# Patient Record
Sex: Male | Born: 1995 | Race: White | Hispanic: No | Marital: Single | Smoking: Never smoker
Health system: Southern US, Community
[De-identification: ages and names within clinical notes are randomized; demographics above are authoritative.]

## PROBLEM LIST (undated history)

## (undated) DIAGNOSIS — C801 Malignant (primary) neoplasm, unspecified: Secondary | ICD-10-CM

---
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
## 2016-01-23 ENCOUNTER — Encounter: Payer: Self-pay | Admitting: Family Medicine

## 2017-02-13 ENCOUNTER — Encounter: Payer: Self-pay | Admitting: Family Medicine

## 2017-02-13 ENCOUNTER — Ambulatory Visit (INDEPENDENT_AMBULATORY_CARE_PROVIDER_SITE_OTHER): Payer: 59 | Admitting: Family Medicine

## 2017-02-13 VITALS — BP 112/78 | Temp 98.1°F | Ht 67.0 in | Wt 132.2 lb

## 2017-02-13 DIAGNOSIS — H9202 Otalgia, left ear: Secondary | ICD-10-CM | POA: Diagnosis not present

## 2017-02-13 DIAGNOSIS — H6123 Impacted cerumen, bilateral: Secondary | ICD-10-CM | POA: Diagnosis not present

## 2017-02-13 NOTE — Progress Notes (Signed)
   Subjective:    Patient ID: [facility], male    DOB: June 08, 1996, 21 y.o.   MRN: [id number]  Otalgia   There is pain in the left ear. This is a new problem. Associated symptoms include hearing loss. Pertinent negatives include no abdominal pain. Associated symptoms comments: Pressure, Dizziness.   States no other concerns this visit.   Upon further questioning he relates that the fullness in the ear makes him feel slightly off balance Review of Systems  Constitutional: Negative for fatigue and fever.  HENT: Positive for ear pain and hearing loss.   Respiratory: Negative for shortness of breath.   Cardiovascular: Negative for chest pain.  Gastrointestinal: Negative for abdominal pain.       Objective:   Physical Exam  Constitutional: He appears well-developed.  HENT:  Head: Normocephalic.  Mouth/Throat: Oropharynx is clear and moist. No oropharyngeal exudate.  Neck: Normal range of motion.  Cardiovascular: Normal rate, regular rhythm and normal heart sounds.   No murmur heard. Pulmonary/Chest: Effort normal and breath sounds normal. He has no wheezes.  Lymphadenopathy:    He has no cervical adenopathy.  Neurological: He exhibits normal muscle tone.  Skin: Skin is warm and dry.  Nursing note and vitals reviewed.  [id number]       Assessment & Plan:  Overall the patient does appear that he is doing much better in regards to his cancer  He follows up with specialist on regular basis  Otalgia with cerumen impaction referral to ENT

## 2017-04-08 IMAGING — CT CT CHEST W/ CM
2 of 3 series · 11 of 36 positions shown, 13 images · IV contrast (Iodine)
Comparison: No priors.

CLINICAL DATA: 19-year-old male with shortness of breath. Possible
collapsed lung.

EXAM:
CT CHEST WITH CONTRAST
TECHNIQUE: Multidetector CT imaging of the chest was performed during
intravenous contrast administration.
CONTRAST:  18 mL OMNIPAQUE IOHEXOL 300 MG/ML  SOLN

[Series 201: chest with, idose (2) · axial · 0.75mm/px · z∈[+123,+413]mm · 8 of 70 slices shown, 10 images]
[im 6/70  mediastinal]
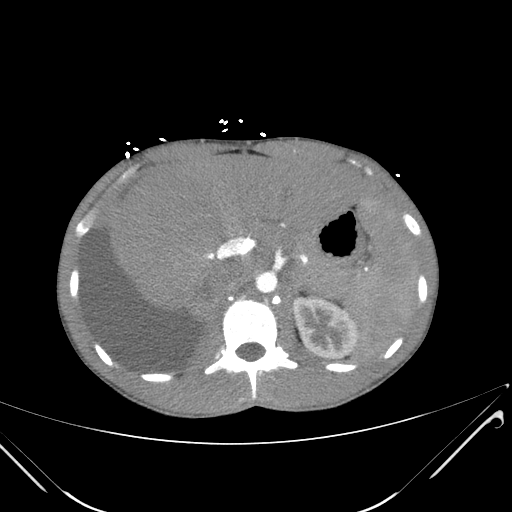
[im 6/70  lung]
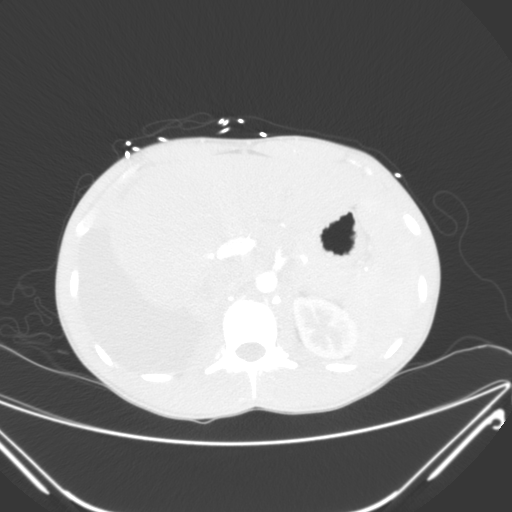
[im 13/70  lung]
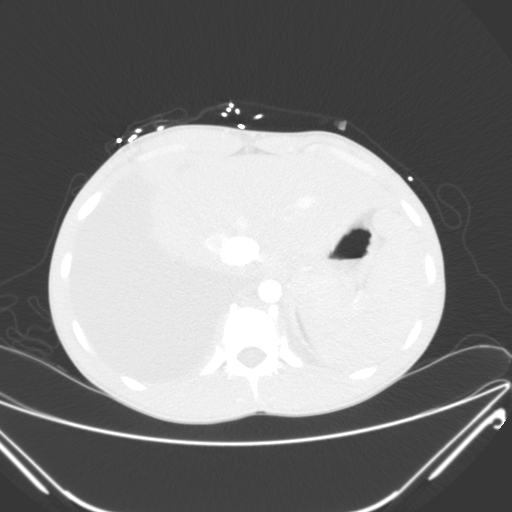
[im 24/70  lung]
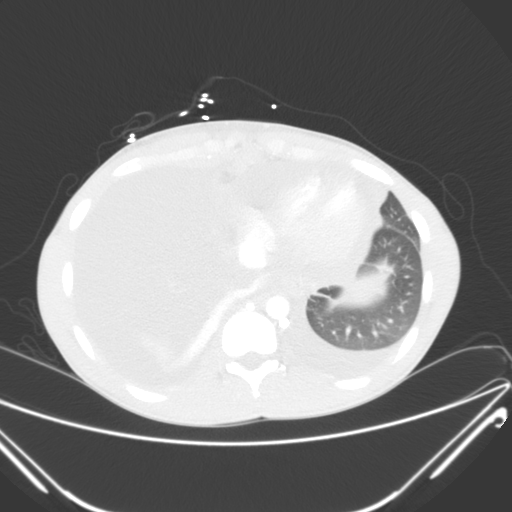
[im 31/70  lung]
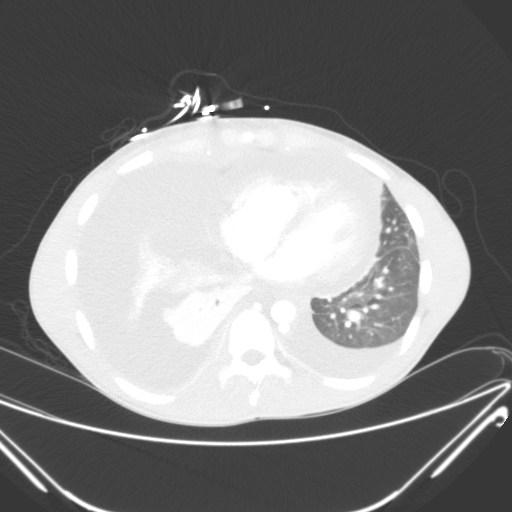
[im 39/70  mediastinal]
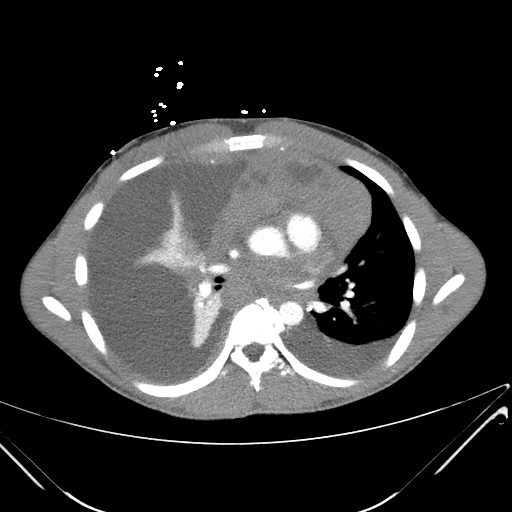
[im 39/70  lung]
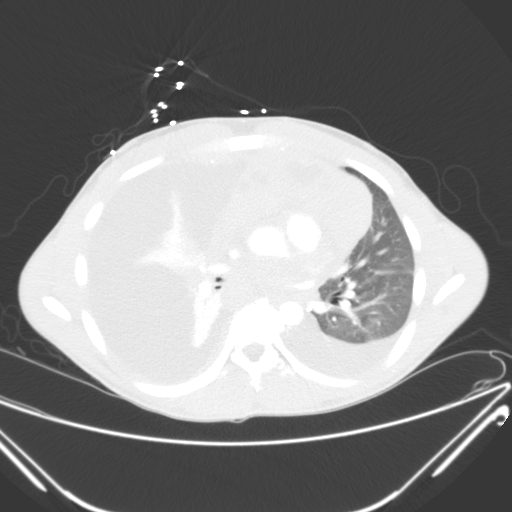
[im 47/70  lung]
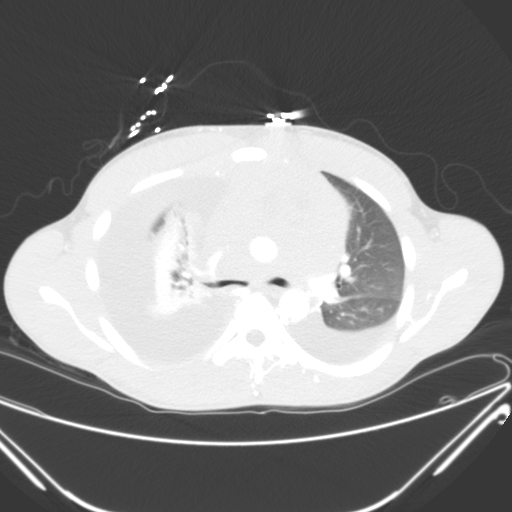
[im 57/70  lung]
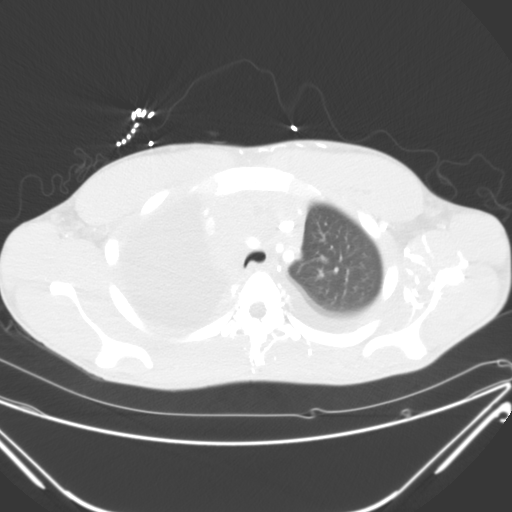
[im 64/70  lung]
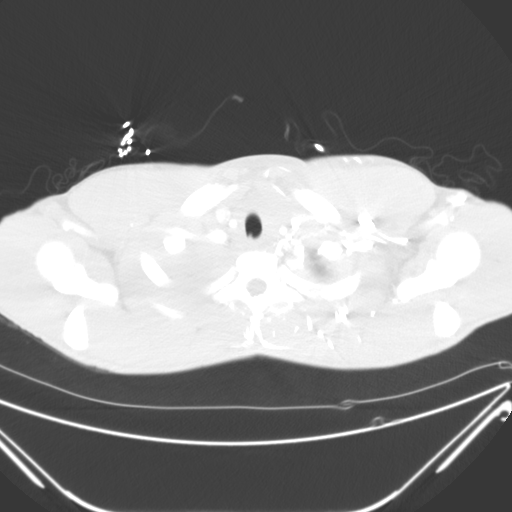

[Series 203: coronal, idose (2) · coronal · 0.45mm/px · 3 of 109 slices shown]
[im 22/109  lung]
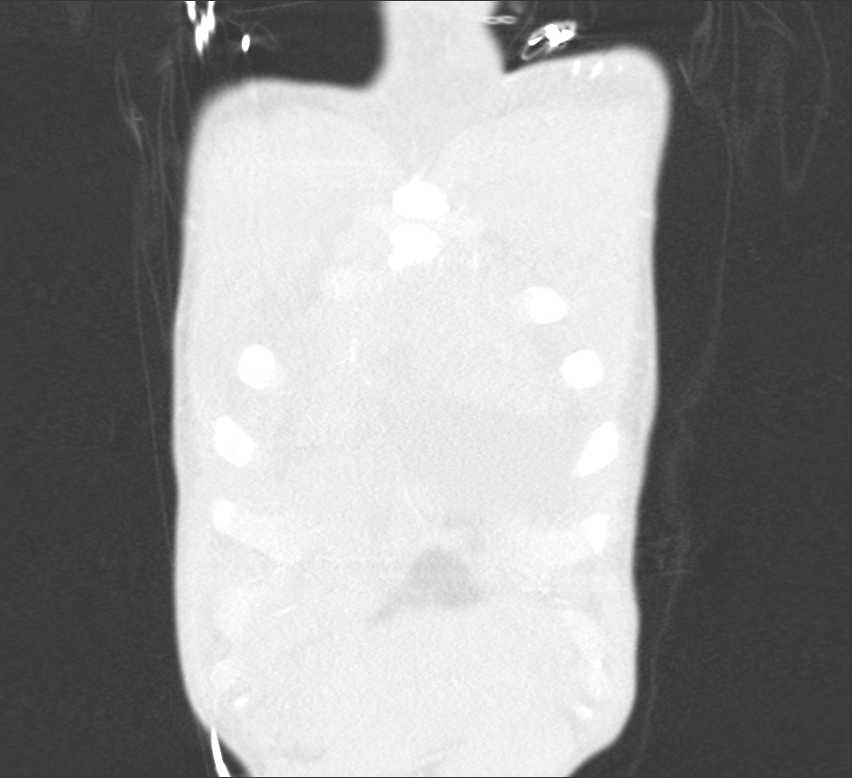
[im 44/109  lung]
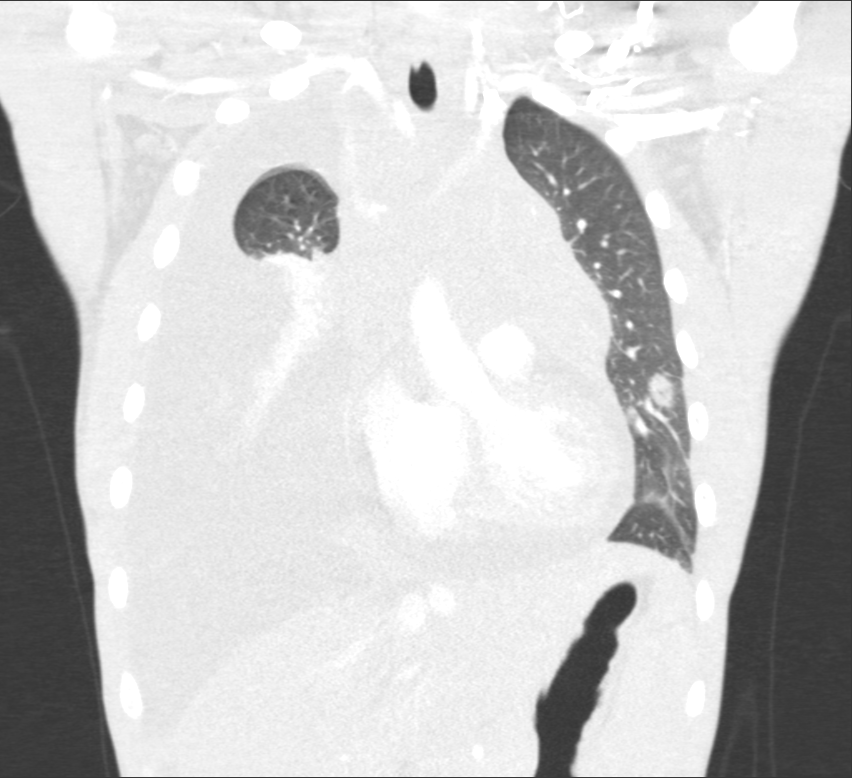
[im 65/109  lung]
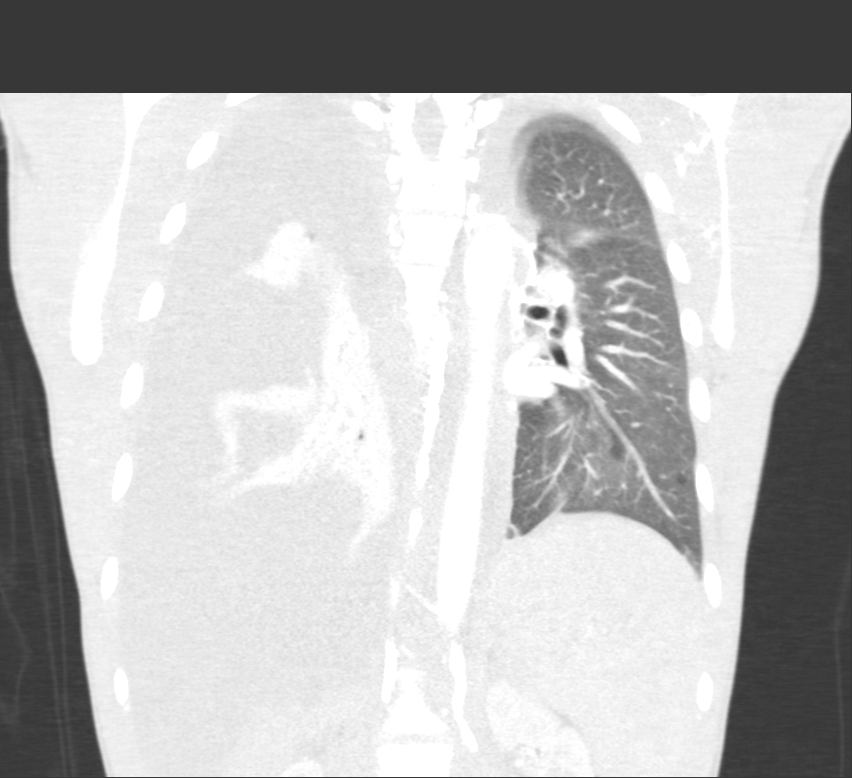

[11 of 36 positions shown; findings below may reference images not displayed]

FINDINGS: Mediastinum/Lymph Nodes: Heart size is normal. Moderate amount of
pericardial fluid and/or thickening. Large amorphous anterior
mediastinal mass estimated to measure at least 5.1 x 5.4 cm,
demonstrating heterogeneous attenuation with internal
low-attenuation areas that may represent cystic regions or areas of
necrosis. There is also abundant abnormal soft tissue in the middle
mediastinum as well, involving subcarinal and paratracheal
(particularly right paratracheal) nodal stations. This mediastinal
lymphadenopathy exerts significant mass effect upon all surrounding
structures, particularly venous structures which are markedly
narrowed. In particular, there is near complete occlusion of the
innominate vein, and severe narrowing of the superior vena cava.
Abnormal soft tissue is also noted in the internal mammary
distributions bilaterally.

Lungs/Pleura: Large right pleural effusion with near complete
atelectasis of the right lung (right middle and lower lobes are
completely atelectatic, while there is still some preserved aeration
in portions of the right upper lobe). Extensive right-sided pleural
thickening and nodularity. Moderate left pleural effusion layering
dependently. This is associated with minimal passive atelectasis in
the left lung. Several large pleural based nodules are noted in the
base of the left hemithorax, particularly anteriorly where there are
2 adjacent nodules measuring 19 x 15 mm and 16 x 21 mm on image 52
of series 201. Several ill-defined pulmonary nodules are also noted
within the left lung, the largest of which measures approximately
1.4 cm in the inferior segment of the lingula (image 36 of series
205).

Upper Abdomen: Extensive retroperitoneal and upper abdominal
lymphadenopathy is incompletely visualized, however, on image 70 of
series 201 mucousy a large conglomerate nodal mass in the right
retroperitoneum encasing the proximal right renal artery.

Musculoskeletal/Soft Tissues: There are no aggressive appearing
lytic or blastic lesions noted in the visualized portions of the
skeleton.
IMPRESSION: 1. Clear evidence of malignancy in the thorax and upper abdomen, as
detailed above. Given the widespread soft tissue masses involving
multiple mediastinal nodal stations, and visualized portions of the
upper abdomen, this is strongly favored to represent a malignant
lymphoma. Correlation with tissue biopsy is recommended.
2. The apparent pleural nodularity likely represents involvement
with pleural and extrapleural lymphatics, but correlation with
tissue biopsy is recommended.
3. Near complete obstruction of the superior vena cava and
innominate vein related to extrinsic mass effect from mediastinal
masses, which may result in superior vena cava syndrome.
4. Large right and small left pleural effusions with extensive
passive atelectasis, particularly in the right lung.
5. Multiple small pulmonary nodules in the left lung. These could be
infectious or inflammatory, or could represent areas of lymphomatous
involvement.
These results were called by telephone at the time of interpretation
on 05/08/2015 at [DATE] to Dr. ELENITA LANDRETH, who verbally
acknowledged these results.

## 2017-11-05 MED ORDER — GENERIC EXTERNAL MEDICATION
50.00 | Status: DC
Start: ? — End: 2017-11-05

## 2017-11-06 MED ORDER — GENERIC EXTERNAL MEDICATION
50.00 | Status: DC
Start: ? — End: 2017-11-06

## 2017-11-06 MED ORDER — SODIUM CHLORIDE 0.9 % IV SOLN
INTRAVENOUS | Status: DC
Start: ? — End: 2017-11-06

## 2017-11-12 MED ORDER — SODIUM CHLORIDE 0.9 % IV SOLN
INTRAVENOUS | Status: DC
Start: ? — End: 2017-11-12

## 2017-11-12 MED ORDER — GENERIC EXTERNAL MEDICATION
50.00 | Status: DC
Start: ? — End: 2017-11-12

## 2017-11-22 MED ORDER — SULFAMETHOXAZOLE-TRIMETHOPRIM 800-160 MG PO TABS
1.00 | ORAL_TABLET | ORAL | Status: DC
Start: 2017-11-22 — End: 2017-11-22

## 2017-11-22 MED ORDER — GENERIC EXTERNAL MEDICATION
500.00 | Status: DC
Start: ? — End: 2017-11-22

## 2017-11-22 MED ORDER — ACETAMINOPHEN 325 MG PO TABS
650.00 | ORAL_TABLET | ORAL | Status: DC
Start: ? — End: 2017-11-22

## 2017-11-22 MED ORDER — ONDANSETRON 4 MG PO TBDP
4.00 | ORAL_TABLET | ORAL | Status: DC
Start: ? — End: 2017-11-22

## 2017-11-22 MED ORDER — RANITIDINE HCL 75 MG PO TABS
75.00 | ORAL_TABLET | ORAL | Status: DC
Start: ? — End: 2017-11-22

## 2017-11-22 MED ORDER — POLYETHYLENE GLYCOL 3350 17 G PO PACK
17.00 g | PACK | ORAL | Status: DC
Start: ? — End: 2017-11-22

## 2017-11-22 MED ORDER — GENERIC EXTERNAL MEDICATION
1.00 g | Status: DC
Start: 2017-11-22 — End: 2017-11-22

## 2017-11-22 MED ORDER — DRONABINOL 2.5 MG PO CAPS
5.00 | ORAL_CAPSULE | ORAL | Status: DC
Start: ? — End: 2017-11-22

## 2017-11-22 MED ORDER — AZITHROMYCIN 250 MG PO TABS
500.00 | ORAL_TABLET | ORAL | Status: DC
Start: 2017-11-22 — End: 2017-11-22

## 2017-11-22 MED ORDER — LIDOCAINE-PRILOCAINE 2.5-2.5 % EX CREA
TOPICAL_CREAM | CUTANEOUS | Status: DC
Start: ? — End: 2017-11-22

## 2017-11-22 MED ORDER — DIPHENHYDRAMINE HCL 25 MG PO CAPS
25.00 | ORAL_CAPSULE | ORAL | Status: DC
Start: ? — End: 2017-11-22

## 2017-11-22 MED ORDER — LIDOCAINE-TRANSPARENT DRESSING 4 % EX KIT
PACK | CUTANEOUS | Status: DC
Start: ? — End: 2017-11-22

## 2018-10-06 HISTORY — PX: PORT-A-CATH REMOVAL: SHX5289

## 2019-03-01 ENCOUNTER — Other Ambulatory Visit: Payer: Self-pay

## 2019-03-01 ENCOUNTER — Ambulatory Visit
Admission: EM | Admit: 2019-03-01 | Discharge: 2019-03-01 | Disposition: A | Discharge status: Home / Self Care | Payer: BLUE CROSS/BLUE SHIELD

## 2019-03-01 DIAGNOSIS — H6123 Impacted cerumen, bilateral: Secondary | ICD-10-CM

## 2019-03-01 HISTORY — DX: Malignant (primary) neoplasm, unspecified: C80.1

## 2019-03-01 NOTE — ED Provider Notes (Signed)
[place]   [id number] 03/01/19 Arrival Time: [place]: History from: patient.  Spencer [facility]clean is a 23 y.o. male who presents with complaint of bilateral ear fullness for the past 4 days.  Denies a precipitating event.  Denies sick exposure to COVID, flu or strep.  Denies recent travel.  Patient has tried OTC ear drops without relief.  Reports similar symptoms in the past that improved with ear lavage.    Denies fever, chills, fatigue, sinus pain, rhinorrhea, ear discharge, sore throat, SOB, wheezing, cough, chest pain, nausea, changes in bowel or bladder habits.    ROS: As per HPI.  Past Medical History:  Diagnosis Date  . Cancer [facility])    Past Surgical History:  Procedure Laterality Date  . PORT-A-CATH REMOVAL  10/06/2018   Allergies  Allergen Reactions  . Neomycin    No current facility-administered medications on file prior to encounter.    No current outpatient medications on file prior to encounter.   Social History   Socioeconomic History  . Marital status: Single    Spouse name: Not on file  . Number of children: Not on file  . Years of education: Not on file  . Highest education level: Not on file  Occupational History  . Not on file  Social Needs  . Financial resource strain: Not on file  . Food insecurity:    Worry: Not on file    Inability: Not on file  . Transportation needs:    Medical: Not on file    Non-medical: Not on file  Tobacco Use  . Smoking status: Never Smoker  . Smokeless tobacco: Never Used  Substance and Sexual Activity  . Alcohol use: Not Currently    Alcohol/week: 0.0 standard drinks  . Drug use: Not Currently  . Sexual activity: Not Currently  Lifestyle  . Physical activity:    Days per week: Not on file    Minutes per session: Not on file  . Stress: Not on file  Relationships  . Social connections:    Talks on phone: Not on file    Gets together: Not on file    Attends religious service: Not  on file    Active member of club or organization: Not on file    Attends meetings of clubs or organizations: Not on file    Relationship status: Not on file  . Intimate partner violence:    Fear of current or ex partner: Not on file    Emotionally abused: Not on file    Physically abused: Not on file    Forced sexual activity: Not on file  Other Topics Concern  . Not on file  Social History Narrative  . Not on file   Family History  Problem Relation Age of Onset  . Hyperlipidemia Father     OBJECTIVE:  Vitals:   03/01/19 1100  BP: 127/70  Pulse: 96  Resp: 18  Temp: 98.3 F (36.8 C)  SpO2: 96%     General appearance: alert; well-appearing HEENT: NCAT; Ears: EACs obstructed by wax; Eyes: PERRL, EOMI grossly; Nose: patent without rhinorrhea; Throat: oropharynx clear, tonsils not enlarged or erythematous, uvula midline Neck: supple without LAD Lungs: normal respiratory effort Skin: warm and dry Psychological: alert and cooperative; normal mood and affect   PROCEDURE:  RT ear cleared with curette.  LT ear partially cleared with minimal bleeding to EAC with curette.  Consent granted.  Left ear lavage performed by RN Amy Stone.  TM visualized by RN.  PT tolerated procedure well.     ASSESSMENT & PLAN:  1. Bilateral impacted cerumen     Wax removal performed with curette and lavage Follow up here or with PCP as needed Return or go to the ED if you have any new or worsening symptoms such as fever, chills, nausea, vomiting, decreased hearing, ear discharge, ear pain, chest pain, coughing, etc...   Reviewed expectations re: course of current medical issues. Questions answered. Outlined signs and symptoms indicating need for more acute intervention. Patient verbalized understanding. After Visit Summary given.         Lestine Box, PA-C 03/01/19 1215

## 2019-03-01 NOTE — ED Triage Notes (Signed)
Pt has experienced ear fullness for past 4 days, left worse than right

## 2019-03-01 NOTE — Discharge Instructions (Signed)
Wax removal performed with curette and lavage Follow up here or with PCP as needed Return or go to the ED if you have any new or worsening symptoms such as fever, chills, nausea, vomiting, decreased hearing, ear discharge, ear pain, chest pain, coughing, etc..Marland Kitchen

## 2019-03-28 DIAGNOSIS — C91Z0 Other lymphoid leukemia not having achieved remission: Secondary | ICD-10-CM | POA: Diagnosis not present

## 2019-04-25 DIAGNOSIS — Z9221 Personal history of antineoplastic chemotherapy: Secondary | ICD-10-CM | POA: Diagnosis not present

## 2019-04-25 DIAGNOSIS — C91Z0 Other lymphoid leukemia not having achieved remission: Secondary | ICD-10-CM | POA: Diagnosis not present

## 2019-04-25 DIAGNOSIS — C91Z1 Other lymphoid leukemia, in remission: Secondary | ICD-10-CM | POA: Diagnosis not present

## 2019-05-02 DIAGNOSIS — Z1283 Encounter for screening for malignant neoplasm of skin: Secondary | ICD-10-CM | POA: Diagnosis not present

## 2019-05-02 DIAGNOSIS — D225 Melanocytic nevi of trunk: Secondary | ICD-10-CM | POA: Diagnosis not present

## 2019-05-02 DIAGNOSIS — D485 Neoplasm of uncertain behavior of skin: Secondary | ICD-10-CM | POA: Diagnosis not present

## 2019-05-02 DIAGNOSIS — D2271 Melanocytic nevi of right lower limb, including hip: Secondary | ICD-10-CM | POA: Diagnosis not present

## 2019-05-23 DIAGNOSIS — C9101 Acute lymphoblastic leukemia, in remission: Secondary | ICD-10-CM | POA: Diagnosis not present

## 2019-06-27 DIAGNOSIS — Z856 Personal history of leukemia: Secondary | ICD-10-CM | POA: Diagnosis not present

## 2019-06-27 DIAGNOSIS — Z79899 Other long term (current) drug therapy: Secondary | ICD-10-CM | POA: Diagnosis not present

## 2019-06-27 DIAGNOSIS — R030 Elevated blood-pressure reading, without diagnosis of hypertension: Secondary | ICD-10-CM | POA: Diagnosis not present

## 2019-06-27 DIAGNOSIS — Z9221 Personal history of antineoplastic chemotherapy: Secondary | ICD-10-CM | POA: Diagnosis not present

## 2019-06-27 DIAGNOSIS — C9101 Acute lymphoblastic leukemia, in remission: Secondary | ICD-10-CM | POA: Diagnosis not present

## 2019-07-26 DIAGNOSIS — C9100 Acute lymphoblastic leukemia not having achieved remission: Secondary | ICD-10-CM | POA: Diagnosis not present

## 2019-08-22 DIAGNOSIS — Z856 Personal history of leukemia: Secondary | ICD-10-CM | POA: Diagnosis not present

## 2019-08-22 DIAGNOSIS — Z888 Allergy status to other drugs, medicaments and biological substances status: Secondary | ICD-10-CM | POA: Diagnosis not present

## 2019-08-22 DIAGNOSIS — Z881 Allergy status to other antibiotic agents status: Secondary | ICD-10-CM | POA: Diagnosis not present

## 2019-08-22 DIAGNOSIS — Z9221 Personal history of antineoplastic chemotherapy: Secondary | ICD-10-CM | POA: Diagnosis not present

## 2019-08-22 DIAGNOSIS — R030 Elevated blood-pressure reading, without diagnosis of hypertension: Secondary | ICD-10-CM | POA: Diagnosis not present

## 2019-08-22 DIAGNOSIS — C91Z1 Other lymphoid leukemia, in remission: Secondary | ICD-10-CM | POA: Diagnosis not present

## 2019-10-31 DIAGNOSIS — C9101 Acute lymphoblastic leukemia, in remission: Secondary | ICD-10-CM | POA: Diagnosis not present

## 2019-10-31 DIAGNOSIS — Z79899 Other long term (current) drug therapy: Secondary | ICD-10-CM | POA: Diagnosis not present

## 2019-10-31 DIAGNOSIS — D696 Thrombocytopenia, unspecified: Secondary | ICD-10-CM | POA: Diagnosis not present

## 2019-10-31 DIAGNOSIS — Z856 Personal history of leukemia: Secondary | ICD-10-CM | POA: Diagnosis not present

## 2019-10-31 DIAGNOSIS — F329 Major depressive disorder, single episode, unspecified: Secondary | ICD-10-CM | POA: Diagnosis not present

## 2019-10-31 DIAGNOSIS — Z9221 Personal history of antineoplastic chemotherapy: Secondary | ICD-10-CM | POA: Diagnosis not present

## 2019-11-02 ENCOUNTER — Encounter: Payer: Self-pay | Admitting: Family Medicine

## 2019-11-03 ENCOUNTER — Encounter: Payer: Self-pay | Admitting: Family Medicine

## 2019-12-26 DIAGNOSIS — Z856 Personal history of leukemia: Secondary | ICD-10-CM | POA: Diagnosis not present

## 2019-12-26 DIAGNOSIS — Z9221 Personal history of antineoplastic chemotherapy: Secondary | ICD-10-CM | POA: Diagnosis not present

## 2019-12-26 DIAGNOSIS — D696 Thrombocytopenia, unspecified: Secondary | ICD-10-CM | POA: Diagnosis not present

## 2019-12-26 DIAGNOSIS — C9101 Acute lymphoblastic leukemia, in remission: Secondary | ICD-10-CM | POA: Diagnosis not present

## 2019-12-26 DIAGNOSIS — F320 Major depressive disorder, single episode, mild: Secondary | ICD-10-CM | POA: Diagnosis not present

## 2020-01-11 DIAGNOSIS — Z03818 Encounter for observation for suspected exposure to other biological agents ruled out: Secondary | ICD-10-CM | POA: Diagnosis not present

## 2020-01-11 DIAGNOSIS — Z20828 Contact with and (suspected) exposure to other viral communicable diseases: Secondary | ICD-10-CM | POA: Diagnosis not present

## 2020-01-25 DIAGNOSIS — Z20822 Contact with and (suspected) exposure to covid-19: Secondary | ICD-10-CM | POA: Diagnosis not present

## 2020-01-25 DIAGNOSIS — Z20828 Contact with and (suspected) exposure to other viral communicable diseases: Secondary | ICD-10-CM | POA: Diagnosis not present

## 2020-01-26 DIAGNOSIS — C9100 Acute lymphoblastic leukemia not having achieved remission: Secondary | ICD-10-CM | POA: Diagnosis not present

## 2020-01-26 DIAGNOSIS — Z113 Encounter for screening for infections with a predominantly sexual mode of transmission: Secondary | ICD-10-CM | POA: Diagnosis not present

## 2020-02-02 DIAGNOSIS — B2790 Infectious mononucleosis, unspecified without complication: Secondary | ICD-10-CM | POA: Diagnosis not present

## 2020-02-02 DIAGNOSIS — J988 Other specified respiratory disorders: Secondary | ICD-10-CM | POA: Diagnosis not present

## 2020-02-27 DIAGNOSIS — C9101 Acute lymphoblastic leukemia, in remission: Secondary | ICD-10-CM | POA: Diagnosis not present

## 2020-02-27 DIAGNOSIS — Z856 Personal history of leukemia: Secondary | ICD-10-CM | POA: Diagnosis not present

## 2020-02-27 DIAGNOSIS — Z9221 Personal history of antineoplastic chemotherapy: Secondary | ICD-10-CM | POA: Diagnosis not present

## 2020-02-27 DIAGNOSIS — D696 Thrombocytopenia, unspecified: Secondary | ICD-10-CM | POA: Diagnosis not present

## 2020-04-30 DIAGNOSIS — C9101 Acute lymphoblastic leukemia, in remission: Secondary | ICD-10-CM | POA: Diagnosis not present

## 2020-07-02 DIAGNOSIS — R110 Nausea: Secondary | ICD-10-CM | POA: Diagnosis not present

## 2020-07-02 DIAGNOSIS — Z9221 Personal history of antineoplastic chemotherapy: Secondary | ICD-10-CM | POA: Diagnosis not present

## 2020-07-02 DIAGNOSIS — C9101 Acute lymphoblastic leukemia, in remission: Secondary | ICD-10-CM | POA: Diagnosis not present

## 2020-07-16 DIAGNOSIS — F411 Generalized anxiety disorder: Secondary | ICD-10-CM | POA: Diagnosis not present

## 2020-08-07 ENCOUNTER — Encounter: Payer: Self-pay | Admitting: Family Medicine

## 2020-08-07 ENCOUNTER — Ambulatory Visit (INDEPENDENT_AMBULATORY_CARE_PROVIDER_SITE_OTHER): Payer: BC Managed Care – PPO | Admitting: Family Medicine

## 2020-08-07 VITALS — BP 124/86 | HR 88 | Temp 98.1°F | Wt 130.2 lb

## 2020-08-07 DIAGNOSIS — F419 Anxiety disorder, unspecified: Secondary | ICD-10-CM | POA: Diagnosis not present

## 2020-08-07 DIAGNOSIS — F32A Depression, unspecified: Secondary | ICD-10-CM | POA: Diagnosis not present

## 2020-08-07 DIAGNOSIS — C9101 Acute lymphoblastic leukemia, in remission: Secondary | ICD-10-CM

## 2020-08-07 MED ORDER — ESCITALOPRAM OXALATE 10 MG PO TABS: 10.0000 mg | ORAL_TABLET | Freq: Every day | ORAL | 2 refills | Status: DC

## 2020-08-07 NOTE — Progress Notes (Signed)
Patient ID: Spencer [facility], male    DOB: 04/22/1996, 24 y.o.   MRN: [id number]   Chief Complaint  Patient presents with  . Depression    Patient has been seeing psychiatry at [facility] and prescribed 10mg  Lexapro. Patient has recently graduated and needs this taken over by pcp.   . Anxiety   Subjective:    HPI F/u depression.  Pt is recent graduate from [facility].    Seen by psychiatry from [facility] and put on lexapro from 6/20,  After graduation from [facility] needing to see pcp for meds. Treating depression. Saw therapist in 2020. Pt is out of college now, working as an account.  Has been off the medication for a couple weeks.  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 Was seen [facility].  q61mo there with onologist. Did chemo and in remission.  HR usually around 70-80s.  99% and 88bpm.- on recheck.  Elevated HR at 114, initially, pt stating has anxiety in dr offices.  Has work and relationship stressors at this time.  Seeing counselor again, restarted last week. Seeing bi-weekly.    Medical History [facility] has a past medical history of Cancer ([facility]).   Outpatient Encounter Medications as of 08/07/2020  Medication Sig  . [DISCONTINUED] escitalopram (LEXAPRO) 10 MG tablet   . escitalopram (LEXAPRO) 10 MG tablet Take 1 tablet (10 mg total) by mouth daily.   No facility-administered encounter medications on file as of 08/07/2020.     Review of Systems  Constitutional: Negative for chills and fever.  HENT: Negative for congestion, rhinorrhea and sore throat.   Respiratory: Negative for cough, shortness of breath and wheezing.   Cardiovascular: Negative for chest pain and leg swelling.  Gastrointestinal: Negative for abdominal pain, diarrhea, nausea and vomiting.  Genitourinary: Negative for dysuria and frequency.  Skin: Negative for rash.  Neurological: Negative for dizziness, weakness and headaches.  Psychiatric/Behavioral: Positive for dysphoric mood.  Negative for sleep disturbance and suicidal ideas. The patient is nervous/anxious.      Vitals BP 124/86   Pulse 88   Temp 98.1 F (36.7 C)   Wt 130 lb 3.2 oz (59.1 kg)   SpO2 100%   BMI 20.39 kg/m   Objective:   Physical Exam Vitals and nursing note reviewed.  Constitutional:      General: He is not in acute distress.    Appearance: Normal appearance. He is not ill-appearing.  HENT:     Head: Normocephalic.     Nose: Nose normal. No congestion.     Mouth/Throat:     Mouth: Mucous membranes are moist.     Pharynx: No oropharyngeal exudate.  Eyes:     Extraocular Movements: Extraocular movements intact.     Conjunctiva/sclera: Conjunctivae normal.     Pupils: Pupils are equal, round, and reactive to light.  Cardiovascular:     Rate and Rhythm: Normal rate and regular rhythm.     Pulses: Normal pulses.     Heart sounds: Normal heart sounds. No murmur heard.   Pulmonary:     Effort: Pulmonary effort is normal.     Breath sounds: Normal breath sounds. No wheezing, rhonchi or rales.  Musculoskeletal:        General: Normal range of motion.     Right lower leg: No edema.     Left lower leg: No edema.  Skin:    General: Skin is warm and dry.     Findings: No rash.  Neurological:     General: No focal deficit present.     Mental Status: He is alert and oriented to person, place, and time.     Cranial Nerves: No cranial nerve deficit.  Psychiatric:        Behavior: Behavior normal.        Thought Content: Thought content normal.        Judgment: Judgment normal.     Comments: +anxious mood      Assessment and Plan   1. Depression, unspecified depression type  2. Anxiety  3. ALL (acute lymphoid leukemia) in remission [facility])   Depresion/anxiety- Not controlled, however, pt ran out of meds 2 wks ago.   Didn't know who to get another refill from since was not able to see his psychiatrist any longer from [facility], since graduating.  Has been off his medication for 2  wks, was on lexapro. -pt cont with counseling.  -call or rto if feeling like anxiety not controlled on the 10mg  lexapro, may need to increase to 20mg  or add buspar for anxiety.  Pt wanting to just try to go back on lexapro at this time at 10mg  dose.  F/u 3 months or sooner if not improving.

## 2020-08-08 DIAGNOSIS — Z23 Encounter for immunization: Secondary | ICD-10-CM | POA: Diagnosis not present

## 2020-08-15 DIAGNOSIS — F411 Generalized anxiety disorder: Secondary | ICD-10-CM | POA: Diagnosis not present

## 2020-08-23 ENCOUNTER — Other Ambulatory Visit: Payer: Self-pay | Admitting: *Deleted

## 2020-08-23 MED ORDER — BUSPIRONE HCL 10 MG PO TABS: 10.0000 mg | ORAL_TABLET | Freq: Three times a day (TID) | ORAL | 0 refills | Status: DC

## 2020-08-27 DIAGNOSIS — F411 Generalized anxiety disorder: Secondary | ICD-10-CM | POA: Diagnosis not present

## 2020-09-03 DIAGNOSIS — F419 Anxiety disorder, unspecified: Secondary | ICD-10-CM | POA: Diagnosis not present

## 2020-09-03 DIAGNOSIS — R110 Nausea: Secondary | ICD-10-CM | POA: Diagnosis not present

## 2020-09-03 DIAGNOSIS — F32A Depression, unspecified: Secondary | ICD-10-CM | POA: Diagnosis not present

## 2020-09-03 DIAGNOSIS — C9101 Acute lymphoblastic leukemia, in remission: Secondary | ICD-10-CM | POA: Diagnosis not present

## 2020-09-03 DIAGNOSIS — F411 Generalized anxiety disorder: Secondary | ICD-10-CM | POA: Diagnosis not present

## 2020-09-08 DIAGNOSIS — H6123 Impacted cerumen, bilateral: Secondary | ICD-10-CM | POA: Diagnosis not present

## 2020-09-10 DIAGNOSIS — F411 Generalized anxiety disorder: Secondary | ICD-10-CM | POA: Diagnosis not present

## 2020-09-19 DIAGNOSIS — D225 Melanocytic nevi of trunk: Secondary | ICD-10-CM | POA: Diagnosis not present

## 2020-09-19 DIAGNOSIS — Z1283 Encounter for screening for malignant neoplasm of skin: Secondary | ICD-10-CM | POA: Diagnosis not present

## 2020-09-19 DIAGNOSIS — L720 Epidermal cyst: Secondary | ICD-10-CM | POA: Diagnosis not present

## 2020-09-24 DIAGNOSIS — F411 Generalized anxiety disorder: Secondary | ICD-10-CM | POA: Diagnosis not present

## 2020-09-25 ENCOUNTER — Other Ambulatory Visit: Payer: Self-pay | Admitting: Family Medicine

## 2020-10-08 DIAGNOSIS — F411 Generalized anxiety disorder: Secondary | ICD-10-CM | POA: Diagnosis not present

## 2020-10-24 DIAGNOSIS — F411 Generalized anxiety disorder: Secondary | ICD-10-CM | POA: Diagnosis not present

## 2020-11-07 ENCOUNTER — Encounter: Payer: Self-pay | Admitting: Family Medicine

## 2020-11-07 ENCOUNTER — Ambulatory Visit: Payer: BC Managed Care – PPO | Admitting: Family Medicine

## 2020-11-07 ENCOUNTER — Other Ambulatory Visit: Payer: Self-pay

## 2020-11-07 VITALS — BP 118/76 | HR 84 | Temp 98.2°F | Ht 67.0 in | Wt 134.4 lb

## 2020-11-07 DIAGNOSIS — F32A Depression, unspecified: Secondary | ICD-10-CM

## 2020-11-07 DIAGNOSIS — F419 Anxiety disorder, unspecified: Secondary | ICD-10-CM

## 2020-11-07 MED ORDER — ESCITALOPRAM OXALATE 10 MG PO TABS: 10.0000 mg | ORAL_TABLET | Freq: Every day | ORAL | 2 refills | Status: DC

## 2020-11-07 NOTE — Progress Notes (Signed)
Patient ID: Spencer [facility], male    DOB: 01-10-1996, 25 y.o.   MRN: [id number]   Chief Complaint  Patient presents with  . Anxiety    Follow up- patient states he is doing good on current meds   Subjective:    HPI F/u anxiety- Pt stating working well with lexapro.  Taking buspar prn, not needing it recently. Doing well with work, and able to handle it better.  Was in a stressful relationship.  Felt that he wasn't in a good relationship.  Since break-up in January, things are much better, and less stressful.   No sleep issues.    Medical History Fisher has a past medical history of Cancer ([place]).   Outpatient Encounter Medications as of 11/07/2020  Medication Sig  . busPIRone (BUSPAR) 10 MG tablet TAKE 1 TABLET(10 MG) BY MOUTH THREE TIMES DAILY  . escitalopram (LEXAPRO) 10 MG tablet Take 1 tablet (10 mg total) by mouth daily.  . [DISCONTINUED] escitalopram (LEXAPRO) 10 MG tablet Take 1 tablet (10 mg total) by mouth daily.   No facility-administered encounter medications on file as of 11/07/2020.     Review of Systems  Constitutional: Negative for chills and fever.  HENT: Negative for congestion, rhinorrhea and sore throat.   Respiratory: Negative for cough, shortness of breath and wheezing.   Cardiovascular: Negative for chest pain and leg swelling.  Gastrointestinal: Negative for abdominal pain, diarrhea, nausea and vomiting.  Genitourinary: Negative for dysuria and frequency.  Skin: Negative for rash.  Neurological: Negative for dizziness, weakness and headaches.  Psychiatric/Behavioral: Negative for agitation, behavioral problems, dysphoric mood, self-injury, sleep disturbance and suicidal ideas. The patient is not nervous/anxious.      Vitals BP 118/76   Pulse 84   Temp 98.2 F (36.8 C) (Oral)   Ht 5\' 7"  (1.702 m)   Wt 134 lb 6.4 oz (61 kg)   SpO2 98%   BMI 21.05 kg/m   Objective:   Physical Exam Vitals and nursing note reviewed.  Constitutional:       General: He is not in acute distress.    Appearance: Normal appearance. He is not ill-appearing.  Cardiovascular:     Rate and Rhythm: Normal rate and regular rhythm.     Pulses: Normal pulses.     Heart sounds: Normal heart sounds. No murmur heard.   Pulmonary:     Effort: Pulmonary effort is normal. No respiratory distress.     Breath sounds: Normal breath sounds.  Musculoskeletal:        General: Normal range of motion.  Skin:    General: Skin is warm and dry.     Findings: No rash.  Neurological:     General: No focal deficit present.     Mental Status: He is alert and oriented to person, place, and time.  Psychiatric:        Mood and Affect: Mood normal.        Behavior: Behavior normal.        Thought Content: Thought content normal.        Judgment: Judgment normal.      Assessment and Plan   1. Anxiety - escitalopram (LEXAPRO) 10 MG tablet; Take 1 tablet (10 mg total) by mouth daily.  Dispense: 30 tablet; Refill: 2  2. Depression, unspecified depression type - escitalopram (LEXAPRO) 10 MG tablet; Take 1 tablet (10 mg total) by mouth daily.  Dispense: 30 tablet; Refill: 2     anxiety/depression- improved.   Pt doing  well.  Improved in depression/anxiety with the medications.  Not needing to buspar. Will cont with lexapro.  F/u 81mo for phone visit, and if all going well we can go out to the 16mo visits.

## 2020-11-08 ENCOUNTER — Ambulatory Visit: Payer: BC Managed Care – PPO | Admitting: Family Medicine

## 2020-11-21 DIAGNOSIS — F411 Generalized anxiety disorder: Secondary | ICD-10-CM | POA: Diagnosis not present

## 2020-11-21 DIAGNOSIS — Z20822 Contact with and (suspected) exposure to covid-19: Secondary | ICD-10-CM | POA: Diagnosis not present

## 2020-12-03 DIAGNOSIS — Z856 Personal history of leukemia: Secondary | ICD-10-CM | POA: Diagnosis not present

## 2020-12-03 DIAGNOSIS — C9101 Acute lymphoblastic leukemia, in remission: Secondary | ICD-10-CM | POA: Diagnosis not present

## 2020-12-10 DIAGNOSIS — F411 Generalized anxiety disorder: Secondary | ICD-10-CM | POA: Diagnosis not present

## 2020-12-31 DIAGNOSIS — F411 Generalized anxiety disorder: Secondary | ICD-10-CM | POA: Diagnosis not present

## 2021-01-04 DIAGNOSIS — C9101 Acute lymphoblastic leukemia, in remission: Secondary | ICD-10-CM | POA: Diagnosis not present

## 2021-01-04 DIAGNOSIS — F411 Generalized anxiety disorder: Secondary | ICD-10-CM | POA: Diagnosis not present

## 2021-02-06 ENCOUNTER — Encounter: Payer: Self-pay | Admitting: Family Medicine

## 2021-02-06 ENCOUNTER — Telehealth (INDEPENDENT_AMBULATORY_CARE_PROVIDER_SITE_OTHER): Payer: BC Managed Care – PPO | Admitting: Family Medicine

## 2021-02-06 ENCOUNTER — Other Ambulatory Visit: Payer: Self-pay

## 2021-02-06 DIAGNOSIS — F32A Depression, unspecified: Secondary | ICD-10-CM | POA: Diagnosis not present

## 2021-02-06 DIAGNOSIS — F419 Anxiety disorder, unspecified: Secondary | ICD-10-CM | POA: Diagnosis not present

## 2021-02-06 MED ORDER — ESCITALOPRAM OXALATE 10 MG PO TABS: 10.0000 mg | ORAL_TABLET | Freq: Every day | ORAL | 2 refills | Status: DC

## 2021-02-06 NOTE — Progress Notes (Signed)
   Patient ID: [facility], male    DOB: 10-20-1995, 25 y.o.   MRN: [id number]   Virtual Visit via Telephone Note  I connected with [facility] on 02/06/21 at  9:00 AM EDT by telephone and verified that I am speaking with the correct person using two identifiers.  Location: Patient: home Provider: office   I discussed the limitations, risks, security and privacy concerns of performing an evaluation and management service by telephone and the availability of in person appointments. I also discussed with the patient that there may be a patient responsible charge related to this service. The patient expressed understanding and agreed to proceed.   Chief Complaint  Patient presents with  . Anxiety   Subjective:    HPIfollow up on anxiety and depression. Taking lexapro 10mg  one daily. States he is doing well on it.   Doing okay on lexapro.  Not needing the buspar at this time. Only in beginning. Not needing it now.  PHQ9 and GAD 7- showing increase in several days of depression/anxiety, low energy.  Medical History [facility] has a past medical history of Cancer ([place]).   Outpatient Encounter Medications as of 02/06/2021  Medication Sig  . [DISCONTINUED] escitalopram (LEXAPRO) 10 MG tablet Take 1 tablet (10 mg total) by mouth daily.  . busPIRone (BUSPAR) 10 MG tablet TAKE 1 TABLET(10 MG) BY MOUTH THREE TIMES DAILY (Patient not taking: Reported on 02/06/2021)  . escitalopram (LEXAPRO) 10 MG tablet Take 1 tablet (10 mg total) by mouth daily.   No facility-administered encounter medications on file as of 02/06/2021.     Review of Systems  Constitutional: Negative for chills and fever.  HENT: Negative for congestion, rhinorrhea and sore throat.   Respiratory: Negative for cough, shortness of breath and wheezing.   Cardiovascular: Negative for chest pain and leg swelling.  Gastrointestinal: Negative for abdominal pain, diarrhea, nausea and vomiting.  Genitourinary: Negative for dysuria and  frequency.  Skin: Negative for rash.  Neurological: Negative for dizziness, weakness and headaches.  Psychiatric/Behavioral: Positive for dysphoric mood (improving with meds). Negative for self-injury, sleep disturbance and suicidal ideas. The patient is nervous/anxious (improving with meds).      Vitals There were no vitals taken for this visit.  Objective:   Physical Exam  No PE due to phone visit.  Assessment and Plan   1. Anxiety - escitalopram (LEXAPRO) 10 MG tablet; Take 1 tablet (10 mg total) by mouth daily.  Dispense: 30 tablet; Refill: 2  2. Depression, unspecified depression type - escitalopram (LEXAPRO) 10 MG tablet; Take 1 tablet (10 mg total) by mouth daily.  Dispense: 30 tablet; Refill: 2    Pt stating wanting to continue with the 10mg  lexapro, feeling it's helping.  Is having some counseling also. Not needing the buspar at this time. Call or rto if worsening or any new concern.. pt voiced understanding.  Return in about 3 months (around 05/09/2021) for f/u anxiety/depresion.    Follow Up Instructions:    I discussed the assessment and treatment plan with the patient. The patient was provided an opportunity to ask questions and all were answered. The patient agreed with the plan and demonstrated an understanding of the instructions.   The patient was advised to call back or seek an in-person evaluation if the symptoms worsen or if the condition fails to improve as anticipated.  I provided 15 minutes of non-face-to-face time during this encounter.

## 2021-02-18 DIAGNOSIS — F411 Generalized anxiety disorder: Secondary | ICD-10-CM | POA: Diagnosis not present

## 2021-03-05 DIAGNOSIS — C9101 Acute lymphoblastic leukemia, in remission: Secondary | ICD-10-CM | POA: Diagnosis not present

## 2021-03-05 DIAGNOSIS — Z9221 Personal history of antineoplastic chemotherapy: Secondary | ICD-10-CM | POA: Diagnosis not present

## 2021-03-13 DIAGNOSIS — F411 Generalized anxiety disorder: Secondary | ICD-10-CM | POA: Diagnosis not present

## 2021-04-03 DIAGNOSIS — F411 Generalized anxiety disorder: Secondary | ICD-10-CM | POA: Diagnosis not present

## 2021-05-08 DIAGNOSIS — C9101 Acute lymphoblastic leukemia, in remission: Secondary | ICD-10-CM | POA: Diagnosis not present

## 2021-05-08 DIAGNOSIS — Z881 Allergy status to other antibiotic agents status: Secondary | ICD-10-CM | POA: Diagnosis not present

## 2021-06-03 DIAGNOSIS — F411 Generalized anxiety disorder: Secondary | ICD-10-CM | POA: Diagnosis not present

## 2021-08-05 DIAGNOSIS — Z1283 Encounter for screening for malignant neoplasm of skin: Secondary | ICD-10-CM | POA: Diagnosis not present

## 2021-08-05 DIAGNOSIS — D225 Melanocytic nevi of trunk: Secondary | ICD-10-CM | POA: Diagnosis not present

## 2021-08-12 DIAGNOSIS — M542 Cervicalgia: Secondary | ICD-10-CM | POA: Diagnosis not present

## 2021-08-12 DIAGNOSIS — M549 Dorsalgia, unspecified: Secondary | ICD-10-CM | POA: Diagnosis not present

## 2021-08-12 DIAGNOSIS — C9101 Acute lymphoblastic leukemia, in remission: Secondary | ICD-10-CM | POA: Diagnosis not present

## 2021-08-20 ENCOUNTER — Other Ambulatory Visit: Payer: Self-pay

## 2021-08-20 ENCOUNTER — Ambulatory Visit
Admission: EM | Admit: 2021-08-20 | Discharge: 2021-08-20 | Disposition: A | Discharge status: Home / Self Care | Payer: BC Managed Care – PPO | Attending: Physician Assistant | Admitting: Physician Assistant

## 2021-08-20 DIAGNOSIS — R509 Fever, unspecified: Secondary | ICD-10-CM | POA: Diagnosis not present

## 2021-08-20 LAB — POCT URINALYSIS DIP (MANUAL ENTRY)
Bilirubin, UA: NEGATIVE
Blood, UA: NEGATIVE
Glucose, UA: NEGATIVE mg/dL
Ketones, POC UA: NEGATIVE mg/dL
Leukocytes, UA: NEGATIVE
Nitrite, UA: NEGATIVE
Protein Ur, POC: NEGATIVE mg/dL
Spec Grav, UA: 1.02 (ref 1.010–1.025)
Urobilinogen, UA: 1 E.U./dL
pH, UA: 8 (ref 5.0–8.0)

## 2021-08-20 LAB — POCT INFLUENZA A/B
Influenza A, POC: NEGATIVE
Influenza B, POC: NEGATIVE

## 2021-08-20 LAB — POCT RAPID STREP A (OFFICE): Rapid Strep A Screen: NEGATIVE

## 2021-08-20 MED ORDER — ACETAMINOPHEN 325 MG PO TABS
650.0000 mg | ORAL_TABLET | Freq: Once | ORAL | Status: AC
  Administered 2021-08-20: 650 mg via ORAL

## 2021-08-20 NOTE — Discharge Instructions (Addendum)
we will contact you with your additional lab results.  Please continue alternating Tylenol and ibuprofen for fever.  If you develop any other symptoms please return for reevaluation.  Follow-up with your PCP and/or hematologist/oncologist particularly if fevers do not resolve.  If you have any severe symptoms please go to the emergency room as we discussed.

## 2021-08-20 NOTE — ED Provider Notes (Signed)
[facility]    CSN: [id number] Arrival date & time: 08/20/21  1534      History   Chief Complaint Chief Complaint  Patient presents with   Fever    HPI Spencer Mclean is a 25 y.o. male.   Patient presents today with a 4-day history of fever with highest recorded 102 F.  Reports mild nasal congestion but denies additional symptoms including significant cough, nausea, vomiting, diarrhea, sore throat.  Denies any known sick contacts.  He is up-to-date on influenza and COVID-19 vaccination but has not had booster.  Has not had COVID-19 in the past.  He has been using Tylenol ibuprofen without improvement of symptoms.  He denies history of allergies or asthma.  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  Denies any significant fatigue.   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 Authorizing Provider  busPIRone (BUSPAR) 10 MG tablet TAKE 1 TABLET(10 MG) BY MOUTH THREE TIMES DAILY Patient not taking: No sig reported 09/25/20   Erven Colla, DO    Family History Family History  Problem Relation Age of Onset   Hyperlipidemia Father     Social History Social History   Tobacco Use   Smoking status: Never   Smokeless tobacco: Never  Substance Use Topics   Alcohol use: Not Currently    Alcohol/week: 0.0 standard drinks   Drug use: Not Currently     Allergies   Pegaspargase and Neomycin   Review of Systems Review of Systems  Constitutional:  Positive for activity change and fever. Negative for appetite change and fatigue.  HENT:  Positive for congestion. Negative for sinus pressure,  sneezing and sore throat.   Respiratory:  Negative for cough and shortness of breath.   Cardiovascular:  Negative for chest pain.  Gastrointestinal:  Positive for nausea. Negative for abdominal pain, diarrhea and vomiting.  Musculoskeletal:  Positive for arthralgias and myalgias.  Neurological:  Negative for dizziness, light-headedness and headaches.    Physical Exam Triage Vital Signs ED Triage Vitals  Enc Vitals Group     BP 08/20/21 1712 (!) 143/87     Pulse Rate 08/20/21 1712 (!) 110     Resp 08/20/21 1712 18     Temp 08/20/21 1712 (!) 101.2 F (38.4 C)     Temp Source 08/20/21 1712 Oral     SpO2 08/20/21 1712 96 %     Weight --      Height --      Head Circumference --      Peak Flow --      Pain Score 08/20/21 1714 3     Pain Loc --      Pain Edu? --      Excl. in [place]? --    No data found.  Updated Vital Signs BP 123/84 (BP Location: Left Arm)   Pulse (!) 113   Temp 99.5 F (37.5 C) (Oral)   Resp 18   SpO2 98%   Visual Acuity Right Eye Distance:   Left Eye Distance:   Bilateral Distance:  Right Eye Near:   Left Eye Near:    Bilateral Near:     Physical Exam Vitals reviewed.  Constitutional:      General: He is awake.     Appearance: Normal appearance. He is well-developed. He is not ill-appearing.     Comments: Very pleasant male appears stated age in no acute distress sitting comfortably in exam room  HENT:     Head: Normocephalic and atraumatic.     Right Ear: Tympanic membrane, ear canal and external ear normal. Tympanic membrane is not erythematous or bulging.     Left Ear: Tympanic membrane, ear canal and external ear normal. Tympanic membrane is not erythematous or bulging.     Nose: Nose normal.     Mouth/Throat:     Pharynx: Uvula midline. Posterior oropharyngeal erythema present. No oropharyngeal exudate.     Tonsils: No tonsillar exudate or tonsillar abscesses. 2+ on the right. 2+ on the left.  Cardiovascular:     Rate and Rhythm: Regular  rhythm. Tachycardia present.     Heart sounds: Normal heart sounds, S1 normal and S2 normal. No murmur heard. Pulmonary:     Effort: Pulmonary effort is normal. No accessory muscle usage or respiratory distress.     Breath sounds: Normal breath sounds. No stridor. No wheezing, rhonchi or rales.     Comments: Clear to auscultation bilaterally Abdominal:     General: Bowel sounds are normal.     Palpations: Abdomen is soft.     Tenderness: There is no abdominal tenderness.  Neurological:     Mental Status: He is alert.  Psychiatric:        Behavior: Behavior is cooperative.     UC Treatments / Results  Labs (all labs ordered are listed, but only abnormal results are displayed) Labs Reviewed  NOVEL CORONAVIRUS, NAA  CBC WITH DIFFERENTIAL/PLATELET  COMPREHENSIVE METABOLIC PANEL  POCT URINALYSIS DIP (MANUAL ENTRY)  POCT RAPID STREP A (OFFICE)  POCT INFLUENZA A/B    EKG   Radiology No results found.  Procedures Procedures (including critical care time)  Medications Ordered in UC Medications  acetaminophen (TYLENOL) tablet 650 mg (650 mg Oral Given 08/20/21 1740)    Initial Impression / Assessment and Plan / UC Course  I have reviewed the triage vital signs and the nursing notes.  Pertinent labs & imaging results that were available during my care of the patient were reviewed by me and considered in my medical decision making (see chart for details).     Patient had improvement of fever with Tylenol in clinic. Flu testing, strep testing, urine were all negative.  COVID was sent out.  CBC and CMP obtained today-results pending.  Discussed that if he develops any additional symptoms he should be reevaluated for further work-up.  Discussed that if he continues to have fever without identifiable explanation he needs to see his hematologist/oncologist for further evaluation.  Discussed alarm symptoms that warrant emergent evaluation.  Return precautions given to which she  expressed understanding.  Final Clinical Impressions(s) / UC Diagnoses   Final diagnoses:  Fever, unspecified     Discharge Instructions      we will contact you with your additional lab results.  Please continue alternating Tylenol and ibuprofen for fever.  If you develop any other symptoms please return for reevaluation.  Follow-up with your PCP and/or hematologist/oncologist particularly if fevers do not resolve.  If you have any severe symptoms please go to the emergency room as we discussed.  ED Prescriptions   None    PDMP not reviewed this encounter.   Terrilee Croak, PA-C 08/20/21 1907

## 2021-08-20 NOTE — ED Triage Notes (Signed)
4 day h/o fever, chills,  tiredness and body aches. Tmax 102.2. Has been taking ibuprofen.  Notes some non productive cough. Pt is covid vaccinated but not boosted.

## 2021-08-21 LAB — COMPREHENSIVE METABOLIC PANEL
ALT: 16 IU/L (ref 0–44)
AST: 18 IU/L (ref 0–40)
Albumin/Globulin Ratio: 2.6 — ABNORMAL HIGH (ref 1.2–2.2)
Albumin: 5 g/dL (ref 4.1–5.2)
Alkaline Phosphatase: 59 IU/L (ref 44–121)
BUN/Creatinine Ratio: 16 (ref 9–20)
BUN: 15 mg/dL (ref 6–20)
Bilirubin Total: 0.3 mg/dL (ref 0.0–1.2)
CO2: 22 mmol/L (ref 20–29)
Calcium: 8.9 mg/dL (ref 8.7–10.2)
Chloride: 100 mmol/L (ref 96–106)
Creatinine, Ser: 0.94 mg/dL (ref 0.76–1.27)
Globulin, Total: 1.9 g/dL (ref 1.5–4.5)
Glucose: 117 mg/dL — ABNORMAL HIGH (ref 70–99)
Potassium: 3.6 mmol/L (ref 3.5–5.2)
Sodium: 139 mmol/L (ref 134–144)
Total Protein: 6.9 g/dL (ref 6.0–8.5)
eGFR: 116 mL/min/{1.73_m2} (ref >59)

## 2021-08-21 LAB — CBC WITH DIFFERENTIAL/PLATELET
Basophils Absolute: 0 10*3/uL (ref 0.0–0.2)
Basos: 0 %
EOS (ABSOLUTE): 0 10*3/uL (ref 0.0–0.4)
Eos: 0 %
Hematocrit: 45.7 % (ref 37.5–51.0)
Hemoglobin: 16 g/dL (ref 13.0–17.7)
Immature Grans (Abs): 0 10*3/uL (ref 0.0–0.1)
Immature Granulocytes: 0 %
Lymphocytes Absolute: 1.5 10*3/uL (ref 0.7–3.1)
Lymphs: 37 %
MCH: 30.5 pg (ref 26.6–33.0)
MCHC: 35 g/dL (ref 31.5–35.7)
MCV: 87 fL (ref 79–97)
Monocytes Absolute: 0.4 10*3/uL (ref 0.1–0.9)
Monocytes: 10 %
Neutrophils Absolute: 2.2 10*3/uL (ref 1.4–7.0)
Neutrophils: 53 %
Platelets: 135 10*3/uL — ABNORMAL LOW (ref 150–450)
RBC: 5.24 x10E6/uL (ref 4.14–5.80)
RDW: 11.7 % (ref 11.6–15.4)
WBC: 4.2 10*3/uL (ref 3.4–10.8)

## 2021-08-21 LAB — SARS-COV-2, NAA 2 DAY TAT

## 2021-08-21 LAB — NOVEL CORONAVIRUS, NAA: SARS-CoV-2, NAA: NOT DETECTED

## 2021-09-02 DIAGNOSIS — M9901 Segmental and somatic dysfunction of cervical region: Secondary | ICD-10-CM | POA: Diagnosis not present

## 2021-09-02 DIAGNOSIS — M542 Cervicalgia: Secondary | ICD-10-CM | POA: Diagnosis not present

## 2021-09-02 DIAGNOSIS — M546 Pain in thoracic spine: Secondary | ICD-10-CM | POA: Diagnosis not present

## 2021-09-02 DIAGNOSIS — M9902 Segmental and somatic dysfunction of thoracic region: Secondary | ICD-10-CM | POA: Diagnosis not present

## 2021-09-20 ENCOUNTER — Ambulatory Visit (INDEPENDENT_AMBULATORY_CARE_PROVIDER_SITE_OTHER): Payer: BC Managed Care – PPO | Admitting: Family Medicine

## 2021-09-20 ENCOUNTER — Other Ambulatory Visit: Payer: Self-pay

## 2021-09-20 VITALS — BP 130/78 | HR 95 | Temp 97.8°F | Ht 67.0 in | Wt 141.2 lb

## 2021-09-20 DIAGNOSIS — M898X1 Other specified disorders of bone, shoulder: Secondary | ICD-10-CM | POA: Diagnosis not present

## 2021-09-20 NOTE — Progress Notes (Signed)
Subjective:  Patient ID: Spencer Mclean, male    DOB: September 25, 1996  Age: 25 y.o. MRN: [id number]  CC: Chief Complaint  Patient presents with   Back Pain    Pt having annoying amount of back pain on right side shoulder blade area that is also causing soreness in right side chest area. Pt states feel like muscle knot. Did go to chiropractor and has tried OTC-not effective. 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 Generally worse when sitting for extended periods of time.     HPI:  25 year old male presents for evaluation the above.  Patient states that he has had pain around the medial aspect of his right scapula for the past 3 to 4 months.  He states that it feels like a "knot" in the area causing discomfort.  Patient also reports some pain on the medial aspect of his bicep.  He has seen a chiropractor without resolution.  This is likely secondary to posture as he sits at work all day.  No fall, trauma, injury.  No radicular symptoms.  No other associated symptoms.  No other complaints.  Patient Active Problem List   Diagnosis Date Noted   Periscapular pain 09/20/2021   ALL (acute lymphoid leukemia) in remission [facility]) 08/07/2020   Depression 08/07/2020   Anxiety 08/07/2020    Social Hx   Social History   Socioeconomic History   Marital status: Single    Spouse name: Not on file   Number of children: Not on file   Years of education: Not on file   Highest education level: Not on file  Occupational History   Not on file  Tobacco Use   Smoking status: Never   Smokeless tobacco: Never  Substance and Sexual Activity   Alcohol use: Not Currently    Alcohol/week: 0.0 standard drinks   Drug use: Not Currently   Sexual activity: Not Currently  Other Topics Concern   Not on file  Social History Narrative   Not on file   Social Determinants of Health   Financial Resource Strain: Not on file  Food Insecurity: Not on file   Transportation Needs: Not on file  Physical Activity: Not on file  Stress: Not on file  Social Connections: Not on file    Review of Systems Per HPI  Objective:  BP 130/78    Pulse 95    Temp 97.8 F (36.6 C)    Ht 5\' 7"  (1.702 m)    Wt 141 lb 3.2 oz (64 kg)    SpO2 99%    BMI 22.12 kg/m   BP/Weight 09/20/2021 97/67/3419 12/11/9022  Systolic BP 097 353 299  Diastolic BP 78 84 76  Wt. (Lbs) 141.2 - 134.4  BMI 22.12 - 21.05    Physical Exam Vitals and nursing note reviewed.  Constitutional:      General: He is not in acute distress.    Appearance: Normal appearance. He is not ill-appearing.  Eyes:     General:        Right eye: No discharge.        Left eye: No discharge.     Conjunctiva/sclera: Conjunctivae normal.  Cardiovascular:     Rate and Rhythm: Normal rate and regular rhythm.  Pulmonary:     Effort: Pulmonary effort is normal.     Breath sounds: Normal breath sounds. No wheezing, rhonchi or rales.  Musculoskeletal:  Comments: Muscle tension/spasm noted around the medial aspect of the right scapula.  Neurological:     Mental Status: He is alert.  Psychiatric:        Mood and Affect: Mood normal.        Behavior: Behavior normal.    Lab Results  Component Value Date   WBC 4.2 08/20/2021   HGB 16.0 08/20/2021   HCT 45.7 08/20/2021   PLT 135 (L) 08/20/2021   GLUCOSE 117 (H) 08/20/2021   ALT 16 08/20/2021   AST 18 08/20/2021   NA 139 08/20/2021   K 3.6 08/20/2021   CL 100 08/20/2021   CREATININE 0.94 08/20/2021   BUN 15 08/20/2021   CO2 22 08/20/2021   INR 1.25 05/08/2015     Assessment & Plan:   Problem List Items Addressed This Visit       Other   Periscapular pain - Primary    MSK in nature.  Advised massage, over-the-counter ibuprofen.  Placing referral to PT.      Relevant Orders   Ambulatory referral to Physical Therapy    Follow-up:  Return if symptoms worsen or fail to improve.  [name]

## 2021-09-20 NOTE — Patient Instructions (Signed)
I have placed referral to PT.  Get yourself a massage by a good masseuse.  Ibuprofen as needed.  Take care  Dr. Vernetta Honey

## 2021-09-20 NOTE — Assessment & Plan Note (Signed)
MSK in nature.  Advised massage, over-the-counter ibuprofen.  Placing referral to PT.

## 2021-10-10 ENCOUNTER — Encounter (HOSPITAL_COMMUNITY): Payer: Self-pay | Admitting: Physical Therapy

## 2021-10-10 ENCOUNTER — Other Ambulatory Visit: Payer: Self-pay

## 2021-10-10 ENCOUNTER — Ambulatory Visit (HOSPITAL_COMMUNITY): Payer: BC Managed Care – PPO | Attending: Family Medicine | Admitting: Physical Therapy

## 2021-10-10 DIAGNOSIS — M25511 Pain in right shoulder: Secondary | ICD-10-CM | POA: Insufficient documentation

## 2021-10-10 DIAGNOSIS — R293 Abnormal posture: Secondary | ICD-10-CM | POA: Insufficient documentation

## 2021-10-10 DIAGNOSIS — M898X1 Other specified disorders of bone, shoulder: Secondary | ICD-10-CM | POA: Insufficient documentation

## 2021-10-10 NOTE — Therapy (Signed)
[place], [place], [address] Phone: [phone]   Fax:  [phone]  Physical Therapy Evaluation  Patient Details  Name: Spencer Mclean MRN: [id number] Date of Birth: [birth year] Referring Provider (PT): Thersa Salt DO   Encounter Date: 10/10/2021   PT End of Session - 10/10/21 0946     Visit Number 1    Number of Visits 8    Date for PT Re-Evaluation 11/07/21    Authorization Type BCBS COMM PPO    PT Start Time 0900    PT Stop Time 0945    PT Time Calculation (min) 45 min    Activity Tolerance Patient tolerated treatment well    Behavior During Therapy [facility] for tasks assessed/performed             Past Medical History:  Diagnosis Date   Cancer [facility])     Past Surgical History:  Procedure Laterality Date   PORT-A-CATH REMOVAL  10/06/2018    There were no vitals filed for this visit.    Subjective Assessment - 10/10/21 0908     Subjective Patient presents to therapy with complaint of RT scapular pain. This began in September. No notable mechanism. This pain has been pretty consistent. He also notes some pains in RT bicep area starting in October. He has been doing some stretches from his Chiropractor for about 2 months but these do not seem to help.    Limitations Lifting;House hold activities;Other (comment)    Patient Stated Goals Fix back pain    Currently in Pain? Yes    Pain Score 2     Pain Location Scapula    Pain Orientation Right    Pain Descriptors / Indicators Aching;Dull    Pain Type Acute pain    Pain Onset More than a month ago    Pain Frequency Constant    Aggravating Factors  posturing, arm position    Pain Relieving Factors NSAIDs, movement    Effect of Pain on Daily Activities Limits                [facility] PT Assessment - 10/10/21 0001       Assessment   Medical Diagnosis RT scapular pain    Referring Provider (PT) Thersa Salt DO    Prior Therapy No      Precautions    Precautions None      Restrictions   Weight Bearing Restrictions No      Prior Function   Level of Independence Independent    Vocation Full time employment    Vocation Requirements accountant      Cognition   Overall Cognitive Status Within Functional Limits for tasks assessed      Posture/Postural Control   Posture/Postural Control Postural limitations    Postural Limitations Rounded Shoulders;Forward head      ROM / Strength   AROM / PROM / Strength AROM;Strength      AROM   Overall AROM Comments cervical and bilateral shoulder AROM WFL      Strength   Overall Strength Comments RT mid and lower trap 4/5 with noted scapular winging      Flexibility   Soft Tissue Assessment /Muscle Length yes   Mod restriction in RT trap flexibility     Palpation   Palpation comment Mod TTP about RT rhomboid                        Objective  measurements completed on examination: See above findings.       [facility] PT Treatment/Exercise - 10/10/21 0001       Exercises   Exercises Shoulder      Shoulder Exercises: Seated   Other Seated Exercises chin tuck 10 x 3"      Shoulder Exercises: Prone   Other Prone Exercises prone scap retraction, prone Ys 5 x 10" each                     PT Education - 10/10/21 0912     Education Details on evaluation findings, POC and HEP    Person(s) Educated Patient    Methods Explanation;Handout    Comprehension Verbalized understanding              PT Short Term Goals - 10/10/21 0940       PT SHORT TERM GOAL #1   Title Patient will be independent with initial HEP and self-management strategies to improve functional outcomes    Time 2    Period Weeks    Status New    Target Date 10/24/21               PT Long Term Goals - 10/10/21 0940       PT LONG TERM GOAL #1   Title Patient will be independent with advanced HEP and self-management strategies to improve functional outcomes    Time 4     Period Weeks    Status New    Target Date 11/07/21      PT LONG TERM GOAL #2   Title Patient will report at least 75% overall improvement in subjective complaint to indicate improvement in ability to perform ADLs.    Time 4    Period Weeks    Status New    Target Date 11/07/21      PT LONG TERM GOAL #3   Title Patient will improve RT mid and lower trap MMT to at least 4+/5 with no compensation to demo improved scapular stability and posturing for reduced pain and improved ability with ADLs.    Time 4    Period Weeks    Status New    Target Date 11/07/21                    Plan - 10/10/21 0933     Clinical Impression Statement Patient is a 26 y.o. male who presents to physical therapy with complaint of RT scapular pain. Patient demonstrates decreased strength, flexibility, increased tenderness to palpation and postural abnormalities which are likely contributing to symptoms of pain and are negatively impacting patient ability to perform ADLs. Patient will benefit from skilled physical therapy services to address these deficits to reduce pain and improve level of function with ADLs    Examination-Activity Limitations Lift;Reach Overhead;Sit;Other    Examination-Participation Restrictions Community Activity;Occupation    Stability/Clinical Decision Making Stable/Uncomplicated    Clinical Decision Making Low    Rehab Potential Good    PT Frequency 2x / week    PT Duration 4 weeks    PT Treatment/Interventions ADLs/Self Care Home Management;Biofeedback;Contrast Bath;Fluidtherapy;Parrafin;Ultrasound;Neuromuscular re-education;Compression bandaging;Scar mobilization;Spinal Manipulations;Passive range of motion;Patient/family education;DME Instruction;Functional mobility training;Electrical Stimulation;Cryotherapy;Dry needling;Energy conservation;Joint Manipulations;Orthotic Fit/Training;Splinting;Taping;Vasopneumatic Device;Manual techniques;Therapeutic activities;Iontophoresis  4mg /ml Dexamethasone;Moist Heat;Balance training;Traction;Therapeutic exercise    PT Next Visit Plan Review HEP. Progress scapular strength. Bands, thoracic mobility.    PT Home Exercise Plan Eval: prone scap retraction, prone Ys, chin tuck    Consulted and  Agree with Plan of Care Patient             Patient will benefit from skilled therapeutic intervention in order to improve the following deficits and impairments:  Postural dysfunction, Impaired flexibility, Improper body mechanics, Pain, Impaired UE functional use, Increased fascial restricitons, Decreased strength  Visit Diagnosis: Right shoulder pain, unspecified chronicity - Plan: PT plan of care cert/re-cert  Abnormal posture - Plan: PT plan of care cert/re-cert     Problem List Patient Active Problem List   Diagnosis Date Noted   Periscapular pain 09/20/2021   ALL (acute lymphoid leukemia) in remission ([place]) 08/07/2020   Depression 08/07/2020   Anxiety 08/07/2020    9:47 AM, 10/10/21 Josue Hector PT DPT  Physical Therapist with [facility]  [phone] [place], [place], [address] Phone: [phone]   Fax:  [phone]  Name: Conway Fedora MRN: [id number] Date of Birth: 09-03-1996

## 2021-10-10 NOTE — Patient Instructions (Signed)
Access Code: [email or web address] URL: [email or web address] Date: 10/10/2021 Prepared by: Josue Hector  Exercises Prone Scapular Slide with Shoulder Extension - 3 x daily - 7 x weekly - 2 sets - 10 reps - 5-10 second hold Prone Scapular Retraction Y - 3 x daily - 7 x weekly - 2 sets - 10 reps - 5-10 second hold Seated Cervical Retraction - 3 x daily - 7 x weekly - 2 sets - 10 reps - 3-5 second hold Seated Scapular Retraction - 3 x daily - 7 x weekly - 2 sets - 10 reps - 5-10 second hold

## 2021-10-16 ENCOUNTER — Other Ambulatory Visit: Payer: Self-pay

## 2021-10-16 ENCOUNTER — Ambulatory Visit (HOSPITAL_COMMUNITY): Payer: BC Managed Care – PPO | Admitting: Physical Therapy

## 2021-10-16 DIAGNOSIS — R293 Abnormal posture: Secondary | ICD-10-CM

## 2021-10-16 DIAGNOSIS — M898X1 Other specified disorders of bone, shoulder: Secondary | ICD-10-CM | POA: Diagnosis not present

## 2021-10-16 DIAGNOSIS — M25511 Pain in right shoulder: Secondary | ICD-10-CM

## 2021-10-16 NOTE — Therapy (Signed)
[place], [place], [address] Phone: [phone]   Fax:  [phone]  Physical Therapy Treatment  Patient Details  Name: Spencer Mclean MRN: [id number] Date of Birth: 12-28-1995 Referring Provider (PT): Thersa Salt DO   Encounter Date: 10/16/2021   PT End of Session - 10/16/21 1054     Visit Number 2    Number of Visits 8    Date for PT Re-Evaluation 11/07/21    Authorization Type BCBS COMM PPO    PT Start Time 0836    PT Stop Time 0916    PT Time Calculation (min) 40 min    Activity Tolerance Patient tolerated treatment well    Behavior During Therapy [facility] for tasks assessed/performed             Past Medical History:  Diagnosis Date   Cancer [facility])     Past Surgical History:  Procedure Laterality Date   PORT-A-CATH REMOVAL  10/06/2018    There were no vitals filed for this visit.   Subjective Assessment - 10/16/21 0842     Subjective pt states he thinks it is getting better.  Currently 1/10 pain. Reports compliance with HEP.    Currently in Pain? Yes    Pain Score 1     Pain Location Thoracic    Pain Orientation Right    Pain Descriptors / Indicators Aching;Radiating    Pain Type Acute pain                               [facility] Adult PT Treatment/Exercise - 10/16/21 0001       Shoulder Exercises: Seated   Retraction Both;10 reps    Retraction Limitations WBACKS    Other Seated Exercises UBE backward 4 minutes level 1      Shoulder Exercises: Prone   Other Prone Exercises prone scap retraction, prone Ys, rows 10X each    Other Prone Exercises POE with upper back "sag"      Shoulder Exercises: Standing   Flexion Both;10 reps    Flexion Limitations against wall slow and controlled    Extension Both;10 reps;Theraband    Theraband Level (Shoulder Extension) Level 3 (Green)    Row Both;10 reps;Theraband    Theraband Level (Shoulder Row) Level 3 (Green)    Retraction  Both;10 reps;Theraband    Theraband Level (Shoulder Retraction) Level 3 (Green)    Other Standing Exercises wall arch 10X      Shoulder Exercises: ROM/Strengthening   UBE (Upper Arm Bike) 4 minutes backward level 1      Manual Therapy   Manual Therapy Soft tissue mobilization    Manual therapy comments completed at EOS seperate from all other skilled interventions    Soft tissue mobilization Rt scapular/UT/cervical musculature with focus at upper scapular area due to spasms/trigger point                     PT Education - 10/16/21 0908     Education Details reviewed goals, HEP and pOC moving forward    Person(s) Educated Patient    Methods Explanation;Demonstration;Tactile cues;Verbal cues    Comprehension Verbalized understanding;Returned demonstration;Verbal cues required;Tactile cues required              PT Short Term Goals - 10/16/21 0907       PT SHORT TERM GOAL #1   Title Patient will be independent with initial  HEP and self-management strategies to improve functional outcomes    Time 2    Period Weeks    Status On-going    Target Date 10/24/21               PT Long Term Goals - 10/16/21 0907       PT LONG TERM GOAL #1   Title Patient will be independent with advanced HEP and self-management strategies to improve functional outcomes    Time 4    Period Weeks    Status On-going    Target Date 11/07/21      PT LONG TERM GOAL #2   Title Patient will report at least 75% overall improvement in subjective complaint to indicate improvement in ability to perform ADLs.    Time 4    Period Weeks    Status On-going    Target Date 11/07/21      PT LONG TERM GOAL #3   Title Patient will improve RT mid and lower trap MMT to at least 4+/5 with no compensation to demo improved scapular stability and posturing for reduced pain and improved ability with ADLs.    Time 4    Period Weeks    Status On-going    Target Date 11/07/21                    Plan - 10/16/21 1058     Clinical Impression Statement Reviewed goals, HEP and POC moving forward. Began corner stretch to encourage stretch of tight chest musculature.  Wall arching and UE flexion added as well as theraband postural strengthening.  Pt able to  complete all activities without c/o pain or discomfort.  ROM appears within normal limits with challenges.  Manual completed to Rt scap and UT region with noted tightness and palpable multiple spasm in scapular region.   Pt reported overall improvement with only occasional radiating nerve pain into Rt upper arm.    Examination-Activity Limitations Lift;Reach Overhead;Sit;Other    Examination-Participation Restrictions Community Activity;Occupation    Stability/Clinical Decision Making Stable/Uncomplicated    Rehab Potential Good    PT Frequency 2x / week    PT Duration 4 weeks    PT Treatment/Interventions ADLs/Self Care Home Management;Biofeedback;Contrast Bath;Fluidtherapy;Parrafin;Ultrasound;Neuromuscular re-education;Compression bandaging;Scar mobilization;Spinal Manipulations;Passive range of motion;Patient/family education;DME Instruction;Functional mobility training;Electrical Stimulation;Cryotherapy;Dry needling;Energy conservation;Joint Manipulations;Orthotic Fit/Training;Splinting;Taping;Vasopneumatic Device;Manual techniques;Therapeutic activities;Iontophoresis 4mg /ml Dexamethasone;Moist Heat;Balance training;Traction;Therapeutic exercise    PT Next Visit Plan Progress scapular strength and stability.  begin modified planks, wall washing. thoracic mobility.    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

## 2021-10-22 ENCOUNTER — Encounter (HOSPITAL_COMMUNITY): Payer: BC Managed Care – PPO | Admitting: Physical Therapy

## 2021-10-24 ENCOUNTER — Encounter: Payer: Self-pay | Admitting: Emergency Medicine

## 2021-10-24 ENCOUNTER — Ambulatory Visit
Admission: EM | Admit: 2021-10-24 | Discharge: 2021-10-24 | Disposition: A | Discharge status: Home / Self Care | Payer: BC Managed Care – PPO | Attending: Internal Medicine | Admitting: Internal Medicine

## 2021-10-24 ENCOUNTER — Encounter (HOSPITAL_COMMUNITY): Payer: BC Managed Care – PPO | Admitting: Physical Therapy

## 2021-10-24 ENCOUNTER — Other Ambulatory Visit: Payer: Self-pay

## 2021-10-24 DIAGNOSIS — H65192 Other acute nonsuppurative otitis media, left ear: Secondary | ICD-10-CM | POA: Diagnosis not present

## 2021-10-24 MED ORDER — AMOXICILLIN 875 MG PO TABS: 875.0000 mg | ORAL_TABLET | Freq: Two times a day (BID) | ORAL | 0 refills | Status: AC

## 2021-10-24 NOTE — ED Provider Notes (Signed)
[facility]    CSN: [id number] Arrival date & time: 10/24/21  1122      History   Chief Complaint Chief Complaint  Patient presents with   Otalgia    HPI Spencer Mclean is a 26 y.o. male.   Patient presents with left ear pain that has been present for approximately 2 days.  He denies any associated upper respiratory symptoms or fever.  Denies any trauma or foreign body to the ear.  Patient is concerned for possible barotrauma as he was on a plane approximately 2 days ago as well.  He also has a history of frequent cerumen impaction, so he is concerned for this as well.  Denies any decreased hearing to the ear.  Denies any drainage from the ear.   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 Authorizing Provider  amoxicillin (AMOXIL) 875 MG tablet Take 1 tablet (875 mg total) by mouth 2 (two) times daily for 10 days. 10/24/21 11/03/21 Yes Janea Schwenn, Michele Rockers, FNP    Family History Family History  Problem Relation Age of Onset   Hyperlipidemia Father     Social History Social History   Tobacco Use   Smoking status: Never   Smokeless tobacco: Never  Substance Use Topics   Alcohol use: Not Currently    Alcohol/week: 0.0 standard drinks   Drug use: Not Currently     Allergies   Pegaspargase and Neomycin   Review of Systems Review of Systems Per HPI  Physical Exam Triage Vital Signs ED Triage Vitals [10/24/21 1219]  Enc Vitals Group     BP 132/88     Pulse Rate (!) 101     Resp 16     Temp 98.4 F (36.9 C)     Temp Source Oral     SpO2 98  %     Weight      Height      Head Circumference      Peak Flow      Pain Score 8     Pain Loc      Pain Edu?      Excl. in [place]?    No data found.  Updated Vital Signs BP 132/88 (BP Location: Right Arm)    Pulse (!) 101    Temp 98.4 F (36.9 C) (Oral)    Resp 16    SpO2 98%   Visual Acuity Right Eye Distance:   Left Eye Distance:   Bilateral Distance:    Right Eye Near:   Left Eye Near:    Bilateral Near:     Physical Exam Constitutional:      General: He is not in acute distress.    Appearance: Normal appearance. He is not toxic-appearing or diaphoretic.  HENT:     Head: Normocephalic and atraumatic.     Right Ear: Tympanic membrane, ear canal and external ear normal.     Left Ear: Ear canal and external ear normal. No decreased hearing noted. No drainage, swelling or tenderness.  No middle  ear effusion. There is no impacted cerumen. No foreign body. No mastoid tenderness. Tympanic membrane is erythematous. Tympanic membrane is not perforated or bulging.  Eyes:     Extraocular Movements: Extraocular movements intact.     Conjunctiva/sclera: Conjunctivae normal.  Pulmonary:     Effort: Pulmonary effort is normal.  Neurological:     General: No focal deficit present.     Mental Status: He is alert and oriented to person, place, and time. Mental status is at baseline.  Psychiatric:        Mood and Affect: Mood normal.        Behavior: Behavior normal.        Thought Content: Thought content normal.        Judgment: Judgment normal.     UC Treatments / Results  Labs (all labs ordered are listed, but only abnormal results are displayed) Labs Reviewed - No data to display  EKG   Radiology No results found.  Procedures Procedures (including critical care time)  Medications Ordered in UC Medications - No data to display  Initial Impression / Assessment and Plan / UC Course  I have reviewed the triage vital signs and the nursing notes.  Pertinent labs &  imaging results that were available during my care of the patient were reviewed by me and considered in my medical decision making (see chart for details).     Physical exam is consistent with left otitis media.  Tympanic membrane is intact.  Will treat with amoxicillin antibiotic.  Discussed return precautions.  Patient verbalized understanding and was agreeable with plan. Final Clinical Impressions(s) / UC Diagnoses   Final diagnoses:  Other non-recurrent acute nonsuppurative otitis media of left ear     Discharge Instructions      You have an ear infection which is being treated with an antibiotic.  Follow-up if symptoms persist or worsen.    ED Prescriptions     Medication Sig Dispense Auth. Provider   amoxicillin (AMOXIL) 875 MG tablet Take 1 tablet (875 mg total) by mouth 2 (two) times daily for 10 days. 20 tablet [place], Michele Rockers, [place]      PDMP not reviewed this encounter.   Teodora Medici, [place] 10/24/21 1255

## 2021-10-24 NOTE — ED Triage Notes (Signed)
Left ear pain, possible barotrauma from flying in a plane 2 days prior. Also reports frequent cerumen impaction, on assessment canal does have some ear wax around the edges - but does not fully occlude the lumen. TM visible, appears irritated/possibly ruptured

## 2021-10-24 NOTE — Discharge Instructions (Signed)
You have an ear infection which is being treated with an antibiotic.  Follow-up if symptoms persist or worsen.

## 2021-10-28 ENCOUNTER — Encounter (HOSPITAL_COMMUNITY): Payer: BC Managed Care – PPO

## 2021-10-31 ENCOUNTER — Ambulatory Visit (HOSPITAL_COMMUNITY): Payer: BC Managed Care – PPO | Admitting: Physical Therapy

## 2021-11-04 ENCOUNTER — Ambulatory Visit
Admission: EM | Admit: 2021-11-04 | Discharge: 2021-11-04 | Disposition: A | Discharge status: Home / Self Care | Payer: BC Managed Care – PPO | Attending: Urgent Care | Admitting: Urgent Care

## 2021-11-04 ENCOUNTER — Other Ambulatory Visit: Payer: Self-pay

## 2021-11-04 ENCOUNTER — Ambulatory Visit (HOSPITAL_COMMUNITY): Payer: BC Managed Care – PPO

## 2021-11-04 DIAGNOSIS — T7840XA Allergy, unspecified, initial encounter: Secondary | ICD-10-CM

## 2021-11-04 DIAGNOSIS — Z856 Personal history of leukemia: Secondary | ICD-10-CM

## 2021-11-04 DIAGNOSIS — R233 Spontaneous ecchymoses: Secondary | ICD-10-CM

## 2021-11-04 LAB — POCT MONO SCREEN (KUC): Mono, POC: NEGATIVE

## 2021-11-04 MED ORDER — PREDNISONE 50 MG PO TABS: 50.0000 mg | ORAL_TABLET | Freq: Every day | ORAL | 0 refills | Status: AC

## 2021-11-04 MED ORDER — DIPHENHYDRAMINE HCL 50 MG/ML IJ SOLN
25.0000 mg | Freq: Once | INTRAMUSCULAR | Status: AC
  Administered 2021-11-04: 25 mg via INTRAMUSCULAR

## 2021-11-04 MED ORDER — DEXAMETHASONE SODIUM PHOSPHATE 10 MG/ML IJ SOLN
10.0000 mg | Freq: Once | INTRAMUSCULAR | Status: AC
  Administered 2021-11-04: 10 mg via INTRAMUSCULAR

## 2021-11-04 NOTE — ED Triage Notes (Signed)
Onset yesterday of non pruritic rash that started on his chest and has now spread to his UE and LE. No ointments or creams applied. No new products used. Pt notes that he recently finished a course of amoxicillin but has never had a reaction before.

## 2021-11-04 NOTE — ED Provider Notes (Addendum)
[facility]    CSN: [id number] Arrival date & time: 11/04/21  1523      History   Chief Complaint Chief Complaint  Patient presents with   Rash    HPI [place] Mclean is a 26 y.o. male.   Pleasant 26 year old male presents today with an acute onset of rash starting yesterday morning.  He states he was diagnosed with an ear infection 9 days ago and is on his ninth dose of amoxicillin.  He states at symptom onset he was also having a sore throat, but this has since resolved.  He denies any wheezing, throat swelling, dysphagia, cough, heartburn.  He denies any systemic symptoms.  He took a Benadryl without any symptom resolution.  He admits he is extremely concerned as he has a history of ALL and was told to monitor for any petechiae.  He denies any fever, lymphadenopathy, night sweats, weight loss.   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 Authorizing Provider  predniSONE (DELTASONE) 50 MG tablet Take 1 tablet (50 mg total) by mouth daily with breakfast for 5 days. 11/04/21 11/09/21 Yes Deakin Lacek L, PA    Family History Family History  Problem Relation Age of Onset   Hyperlipidemia Father     Social History Social History   Tobacco Use   Smoking status: Never   Smokeless tobacco: Never  Substance Use Topics   Alcohol use: Not Currently    Alcohol/week: 0.0 standard drinks   Drug use: Not Currently      Allergies   Pegaspargase and Neomycin   Review of Systems Review of Systems  Constitutional:  Negative for fever.  HENT:  Positive for sore throat (resolved). Negative for congestion, drooling, sneezing and trouble swallowing.   Eyes:  Negative for redness.  Respiratory:  Negative for cough, shortness of breath and wheezing.   Musculoskeletal:  Negative for arthralgias.  Skin:  Positive for rash.    Physical Exam Triage Vital Signs ED Triage Vitals  Enc Vitals Group     BP 11/04/21 1632 (!) 155/95     Pulse Rate 11/04/21 1632 (!) 103     Resp 11/04/21 1632 18     Temp 11/04/21 1632 98.4 F (36.9 C)     Temp Source 11/04/21 1632 Oral     SpO2 11/04/21 1632 98 %     Weight --      Height --      Head Circumference --      Peak Flow --      Pain Score 11/04/21 1634 0     Pain Loc --      Pain Edu? --      Excl. in [place]? --    No data found.  Updated Vital Signs BP (!) 155/95 (BP Location: Left Arm) Comment: Relates to OV anxiety   Pulse (!) 103    Temp 98.4 F (  36.9 C) (Oral)    Resp 18    SpO2 98%   Visual Acuity Right Eye Distance:   Left Eye Distance:   Bilateral Distance:    Right Eye Near:   Left Eye Near:    Bilateral Near:     Physical Exam Vitals and nursing note reviewed.  Constitutional:      Appearance: Normal appearance.     Comments: Pt very anxious, pacing around room  HENT:     Head: Normocephalic and atraumatic.     Right Ear: Tympanic membrane, ear canal and external ear normal.     Left Ear: Tympanic membrane, ear canal and external ear normal.     Nose: Nose normal. No congestion or rhinorrhea.     Mouth/Throat:     Mouth: Mucous membranes are moist.     Pharynx: No oropharyngeal exudate or posterior oropharyngeal erythema.  Eyes:     General: No scleral icterus.       Right eye: No discharge.        Left eye: No discharge.     Extraocular Movements: Extraocular movements intact.     Conjunctiva/sclera: Conjunctivae normal.      Pupils: Pupils are equal, round, and reactive to light.  Cardiovascular:     Rate and Rhythm: Normal rate.     Pulses: Normal pulses.     Heart sounds: No murmur heard.   No friction rub. No gallop.  Pulmonary:     Effort: Pulmonary effort is normal. No respiratory distress.     Breath sounds: Normal breath sounds. No stridor. No wheezing, rhonchi or rales.  Chest:     Chest wall: No tenderness.  Abdominal:     General: Abdomen is flat. Bowel sounds are normal. There is no distension.     Tenderness: There is no abdominal tenderness.  Musculoskeletal:        General: No swelling. Normal range of motion.     Cervical back: Normal range of motion. No rigidity or tenderness.     Right lower leg: No edema.     Left lower leg: No edema.  Skin:    General: Skin is warm.     Capillary Refill: Capillary refill takes less than 2 seconds.     Findings: Rash (generalized petechial rash, not raised, no scale) present.  Neurological:     General: No focal deficit present.     Mental Status: He is alert.  Psychiatric:        Mood and Affect: Mood normal.     UC Treatments / Results  Labs (all labs ordered are listed, but only abnormal results are displayed) Labs Reviewed  CBC WITH DIFFERENTIAL/PLATELET  POCT MONO SCREEN [facility])    EKG   Radiology No results found.  Procedures Procedures (including critical care time)  Medications Ordered in UC Medications  dexamethasone (DECADRON) injection 10 mg (10 mg Intramuscular Given 11/04/21 1659)  diphenhydrAMINE (BENADRYL) injection 25 mg (25 mg Intramuscular Given 11/04/21 1704)    Initial Impression / Assessment and Plan / UC Course  I have reviewed the triage vital signs and the nursing notes.  Pertinent labs & imaging results that were available during my care of the patient were reviewed by me and considered in my medical decision making (see chart for details).     Drug reaction/ allergic reaction - benadryl and decadron  given in office. Start PO prednisone in the morning. Continue benadryl. Improvement noted in office while monitoring Hx of ALL -  CBC drawn today  Petechial rash - as above. Call PCP in AM for follow up  Final Clinical Impressions(s) / UC Diagnoses   Final diagnoses:  Allergic reaction to drug, initial encounter  History of acute lymphoblastic leukemia (ALL)  Petechial rash     Discharge Instructions      You were given an injection of dexamethasone and Benadryl in office today. Your Monospot was negative. I highly suspect that your rash is related to recurrence of your past medical history, however we will draw CBC today for monitoring.  Your petechial rash looks similar to a drug eruption or vasculitis. Please call your PCP in the morning to schedule a follow up. Start taking the PO prednisone tomorrow morning. You may also continue to take 25mg  benadryl every 6 hours.      ED Prescriptions     Medication Sig Dispense Auth. Provider   predniSONE (DELTASONE) 50 MG tablet Take 1 tablet (50 mg total) by mouth daily with breakfast for 5 days. 5 tablet Shantelle Alles L, [place]      PDMP not reviewed this encounter.   Chaney Malling, [place] 11/04/21 1805    Chaney Malling, [place] 11/04/21 1810

## 2021-11-04 NOTE — Discharge Instructions (Addendum)
You were given an injection of dexamethasone and Benadryl in office today. Your Monospot was negative. I highly suspect that your rash is related to recurrence of your past medical history, however we will draw CBC today for monitoring.  Your petechial rash looks similar to a drug eruption or vasculitis. Please call your PCP in the morning to schedule a follow up. Start taking the PO prednisone tomorrow morning. You may also continue to take 25mg  benadryl every 6 hours.

## 2021-11-05 LAB — CBC WITH DIFFERENTIAL/PLATELET
Basophils Absolute: 0 10*3/uL (ref 0.0–0.2)
Basos: 0 %
EOS (ABSOLUTE): 0.1 10*3/uL (ref 0.0–0.4)
Eos: 1 %
Hematocrit: 44.8 % (ref 37.5–51.0)
Hemoglobin: 15.4 g/dL (ref 13.0–17.7)
Immature Grans (Abs): 0 10*3/uL (ref 0.0–0.1)
Immature Granulocytes: 0 %
Lymphocytes Absolute: 1.2 10*3/uL (ref 0.7–3.1)
Lymphs: 18 %
MCH: 29.8 pg (ref 26.6–33.0)
MCHC: 34.4 g/dL (ref 31.5–35.7)
MCV: 87 fL (ref 79–97)
Monocytes Absolute: 0.3 10*3/uL (ref 0.1–0.9)
Monocytes: 4 %
Neutrophils Absolute: 5.2 10*3/uL (ref 1.4–7.0)
Neutrophils: 77 %
Platelets: 212 10*3/uL (ref 150–450)
RBC: 5.17 x10E6/uL (ref 4.14–5.80)
RDW: 11.9 % (ref 11.6–15.4)
WBC: 6.8 10*3/uL (ref 3.4–10.8)

## 2021-11-06 ENCOUNTER — Encounter (HOSPITAL_COMMUNITY): Payer: BC Managed Care – PPO | Admitting: Physical Therapy

## 2021-12-09 DIAGNOSIS — Z881 Allergy status to other antibiotic agents status: Secondary | ICD-10-CM | POA: Diagnosis not present

## 2021-12-09 DIAGNOSIS — C9101 Acute lymphoblastic leukemia, in remission: Secondary | ICD-10-CM | POA: Diagnosis not present

## 2021-12-09 DIAGNOSIS — G479 Sleep disorder, unspecified: Secondary | ICD-10-CM | POA: Diagnosis not present

## 2021-12-17 DIAGNOSIS — F331 Major depressive disorder, recurrent, moderate: Secondary | ICD-10-CM | POA: Diagnosis not present

## 2021-12-30 DIAGNOSIS — F331 Major depressive disorder, recurrent, moderate: Secondary | ICD-10-CM | POA: Diagnosis not present

## 2022-01-27 DIAGNOSIS — F331 Major depressive disorder, recurrent, moderate: Secondary | ICD-10-CM | POA: Diagnosis not present

## 2022-02-17 DIAGNOSIS — F331 Major depressive disorder, recurrent, moderate: Secondary | ICD-10-CM | POA: Diagnosis not present

## 2022-02-20 ENCOUNTER — Ambulatory Visit (INDEPENDENT_AMBULATORY_CARE_PROVIDER_SITE_OTHER): Payer: BC Managed Care – PPO | Admitting: Family Medicine

## 2022-02-20 ENCOUNTER — Encounter: Payer: Self-pay | Admitting: Family Medicine

## 2022-02-20 VITALS — BP 134/72 | HR 100 | Temp 98.1°F | Ht 67.0 in | Wt 129.0 lb

## 2022-02-20 DIAGNOSIS — Z23 Encounter for immunization: Secondary | ICD-10-CM

## 2022-02-20 DIAGNOSIS — Z0000 Encounter for general adult medical examination without abnormal findings: Secondary | ICD-10-CM | POA: Diagnosis not present

## 2022-02-20 NOTE — Progress Notes (Signed)
Subjective:  Patient ID: Spencer Mclean, male    DOB: 08/27/1996  Age: 26 y.o. MRN: [id number]  CC: Chief Complaint  Patient presents with   Annual Exam    Discuss HPV and Tetnus booster     HPI:  26 year old male present for an annual exam.  Patient has a history of a LL.  Is in remission.  Doing well.  He has no particular complaints or concerns.  He would like to discuss getting an updated tetanus vaccination as well as starting the HPV vaccine series.  Patient's last tetanus vaccine was in 2009.  He is still within age to get HPV vaccine.  Patient Active Problem List   Diagnosis Date Noted   Annual physical exam 02/20/2022   ALL (acute lymphoid leukemia) in remission [facility]) 08/07/2020   Depression 08/07/2020   Anxiety 08/07/2020    Social Hx   Social History   Socioeconomic History   Marital status: Single    Spouse name: Not on file   Number of children: Not on file   Years of education: Not on file   Highest education level: Not on file  Occupational History   Not on file  Tobacco Use   Smoking status: Never   Smokeless tobacco: Never  Substance and Sexual Activity   Alcohol use: Not Currently    Alcohol/week: 0.0 standard drinks   Drug use: Not Currently   Sexual activity: Not Currently  Other Topics Concern   Not on file  Social History Narrative   Not on file   Social Determinants of Health   Financial Resource Strain: Not on file  Food Insecurity: Not on file  Transportation Needs: Not on file  Physical Activity: Not on file  Stress: Not on file  Social Connections: Not on file    Review of Systems  Constitutional: Negative.   Respiratory: Negative.    Cardiovascular: Negative.    Objective:  BP 134/72   Pulse 100   Temp 98.1 F (36.7 C)   Ht '5\' 7"'$  (1.702 m)   Wt 129 lb (58.5 kg)   SpO2 98%   BMI 20.20 kg/m      02/20/2022    9:06 AM 11/04/2021    4:32 PM 10/24/2021   12:19 PM  BP/Weight  Systolic BP 665 993 570  Diastolic BP 72 95  88  Wt. (Lbs) 129    BMI 20.2 kg/m2      Physical Exam Vitals and nursing note reviewed.  Constitutional:      General: He is not in acute distress.    Appearance: Normal appearance.  HENT:     Head: Normocephalic and atraumatic.     Mouth/Throat:     Pharynx: Oropharynx is clear. No oropharyngeal exudate or posterior oropharyngeal erythema.  Eyes:     General:        Right eye: No discharge.        Left eye: No discharge.     Conjunctiva/sclera: Conjunctivae normal.  Cardiovascular:     Rate and Rhythm: Normal rate and regular rhythm.  Pulmonary:     Effort: Pulmonary effort is normal.     Breath sounds: Normal breath sounds. No wheezing, rhonchi or rales.  Abdominal:     General: There is no distension.     Palpations: Abdomen is soft.     Tenderness: There is no abdominal tenderness.  Musculoskeletal:     Cervical back: Neck supple.  Lymphadenopathy:     Cervical: No  cervical adenopathy.  Neurological:     General: No focal deficit present.     Mental Status: He is alert.  Psychiatric:        Mood and Affect: Mood normal.        Behavior: Behavior normal.    Lab Results  Component Value Date   WBC 6.8 11/04/2021   HGB 15.4 11/04/2021   HCT 44.8 11/04/2021   PLT 212 11/04/2021   GLUCOSE 117 (H) 08/20/2021   ALT 16 08/20/2021   AST 18 08/20/2021   NA 139 08/20/2021   K 3.6 08/20/2021   CL 100 08/20/2021   CREATININE 0.94 08/20/2021   BUN 15 08/20/2021   CO2 22 08/20/2021   INR 1.25 05/08/2015     Assessment & Plan:   Problem List Items Addressed This Visit       Other   Annual physical exam    Doing well.  Normal exam.  HPV vaccine given today as well as tetanus.       Other Visit Diagnoses     Need for vaccination    -  Primary   Relevant Orders   HPV 9-valent vaccine,Recombinat (Completed)   Tdap vaccine greater than or equal to 7yo IM (Completed)       Follow-up:  Return in about 1 year (around 02/21/2023).  [place]

## 2022-02-20 NOTE — Assessment & Plan Note (Signed)
Doing well.  Normal exam.  HPV vaccine given today as well as tetanus.

## 2022-02-20 NOTE — Patient Instructions (Signed)
Follow up annually.  Call with concerns.  Take care  Dr. Kayline Sheer  

## 2022-03-06 DIAGNOSIS — F331 Major depressive disorder, recurrent, moderate: Secondary | ICD-10-CM | POA: Diagnosis not present

## 2022-03-24 DIAGNOSIS — F331 Major depressive disorder, recurrent, moderate: Secondary | ICD-10-CM | POA: Diagnosis not present

## 2022-04-07 DIAGNOSIS — F331 Major depressive disorder, recurrent, moderate: Secondary | ICD-10-CM | POA: Diagnosis not present

## 2022-04-21 DIAGNOSIS — F411 Generalized anxiety disorder: Secondary | ICD-10-CM | POA: Diagnosis not present

## 2022-05-07 DIAGNOSIS — F331 Major depressive disorder, recurrent, moderate: Secondary | ICD-10-CM | POA: Diagnosis not present

## 2022-05-07 DIAGNOSIS — Z9221 Personal history of antineoplastic chemotherapy: Secondary | ICD-10-CM | POA: Diagnosis not present

## 2022-05-07 DIAGNOSIS — C9101 Acute lymphoblastic leukemia, in remission: Secondary | ICD-10-CM | POA: Diagnosis not present

## 2022-05-20 ENCOUNTER — Other Ambulatory Visit (INDEPENDENT_AMBULATORY_CARE_PROVIDER_SITE_OTHER): Payer: BC Managed Care – PPO | Admitting: *Deleted

## 2022-05-20 DIAGNOSIS — Z23 Encounter for immunization: Secondary | ICD-10-CM | POA: Diagnosis not present

## 2022-05-21 DIAGNOSIS — D2261 Melanocytic nevi of right upper limb, including shoulder: Secondary | ICD-10-CM | POA: Diagnosis not present

## 2022-05-21 DIAGNOSIS — D485 Neoplasm of uncertain behavior of skin: Secondary | ICD-10-CM | POA: Diagnosis not present

## 2022-05-21 DIAGNOSIS — D225 Melanocytic nevi of trunk: Secondary | ICD-10-CM | POA: Diagnosis not present

## 2022-05-23 ENCOUNTER — Other Ambulatory Visit: Payer: Self-pay

## 2022-06-01 ENCOUNTER — Other Ambulatory Visit: Payer: Self-pay

## 2022-06-01 ENCOUNTER — Telehealth (HOSPITAL_COMMUNITY): Payer: Self-pay

## 2022-06-01 ENCOUNTER — Ambulatory Visit
Admission: EM | Admit: 2022-06-01 | Discharge: 2022-06-01 | Disposition: A | Discharge status: Home / Self Care | Payer: BC Managed Care – PPO | Attending: Internal Medicine | Admitting: Internal Medicine

## 2022-06-01 DIAGNOSIS — R051 Acute cough: Secondary | ICD-10-CM

## 2022-06-01 DIAGNOSIS — J069 Acute upper respiratory infection, unspecified: Secondary | ICD-10-CM | POA: Diagnosis not present

## 2022-06-01 DIAGNOSIS — R000 Tachycardia, unspecified: Secondary | ICD-10-CM | POA: Diagnosis not present

## 2022-06-01 MED ORDER — AZITHROMYCIN 250 MG PO TABS: ORAL_TABLET | ORAL | 0 refills | Status: AC

## 2022-06-01 MED ORDER — BENZONATATE 100 MG PO CAPS: 100.0000 mg | ORAL_CAPSULE | Freq: Three times a day (TID) | ORAL | 0 refills | Status: DC | PRN

## 2022-06-01 MED ORDER — AZITHROMYCIN 250 MG PO TABS: ORAL_TABLET | ORAL | 0 refills | Status: DC

## 2022-06-01 MED ORDER — BENZONATATE 100 MG PO CAPS
100.0000 mg | ORAL_CAPSULE | Freq: Three times a day (TID) | ORAL | 0 refills | Status: AC | PRN
Start: 2022-06-01 — End: ?

## 2022-06-01 NOTE — ED Provider Notes (Signed)
[facility]    CSN: [id number] Arrival date & time: 06/01/22  1323      History   Chief Complaint Chief Complaint  Patient presents with   Facial Pain    HPI Spencer Mclean is a 26 y.o. male.   Patient presents with sinus pressure, nasal congestion, cough that has been present for about 2 weeks.  Patient reports cough is productive at times and dry at times.  Patient has had intermittent fever with Tmax being 99 and 100.4.  Denies any known sick contacts.  Denies chest pain, shortness of breath, sore throat, ear pain, nausea, vomiting, diarrhea, abdominal pain.  Patient has taken ibuprofen minimal improvement.  Patient has elevated heart rate but reports that he has "whitecoat syndrome" so is attributing his heart rate to this.  Denies history of cardiac related issues.     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 Authorizing Provider  azithromycin (ZITHROMAX Z-PAK) 250 MG tablet Take 2 tablets (500 mg total) by mouth daily for 1 day, THEN 1 tablet (250 mg total) daily for 4 days. 06/01/22 06/06/22 Yes Moselle Rister, Michele Rockers, FNP  benzonatate (TESSALON) 100 MG capsule Take 1 capsule (100 mg total) by mouth every 8 (eight) hours as needed for cough. 06/01/22  Yes Jian Hodgman, Hildred Alamin E, FNP  VITAMIN D, CHOLECALCIFEROL, PO Take by mouth.    [provider]    Family History Family History  Problem Relation Age of Onset   Hyperlipidemia Father     Social History Social History   Tobacco Use   Smoking status: Never   Smokeless tobacco: Never  Substance Use Topics    Alcohol use: Not Currently    Alcohol/week: 0.0 standard drinks of alcohol   Drug use: Not Currently     Allergies   Pegaspargase and Neomycin   Review of Systems Review of Systems Per HPI  Physical Exam Triage Vital Signs ED Triage Vitals  Enc Vitals Group     BP 06/01/22 1337 (!) 144/96     Pulse Rate 06/01/22 1337 (!) 117     Resp 06/01/22 1337 18     Temp 06/01/22 1337 98 F (36.7 C)     Temp src --      SpO2 06/01/22 1337 98 %     Weight --      Height --      Head Circumference --      Peak Flow --      Pain Score 06/01/22 1342 5     Pain Loc --      Pain Edu? --      Excl. in [place]? --    No data found.  Updated Vital Signs BP (!) 144/96   Pulse 97   Temp 98 F (36.7 C)   Resp 18   SpO2 98%   Visual Acuity Right Eye Distance:   Left Eye Distance:   Bilateral Distance:    Right Eye Near:   Left Eye Near:    Bilateral Near:  Physical Exam Constitutional:      General: He is not in acute distress.    Appearance: Normal appearance. He is not toxic-appearing or diaphoretic.  HENT:     Head: Normocephalic and atraumatic.     Right Ear: Tympanic membrane and ear canal normal.     Left Ear: Tympanic membrane and ear canal normal.     Nose: Congestion present.     Mouth/Throat:     Mouth: Mucous membranes are moist.     Pharynx: No posterior oropharyngeal erythema.  Eyes:     Extraocular Movements: Extraocular movements intact.     Conjunctiva/sclera: Conjunctivae normal.     Pupils: Pupils are equal, round, and reactive to light.  Cardiovascular:     Rate and Rhythm: Regular rhythm. Tachycardia present.     Pulses: Normal pulses.     Heart sounds: Normal heart sounds.  Pulmonary:     Effort: Pulmonary effort is normal. No respiratory distress.     Breath sounds: Normal breath sounds. No stridor. No wheezing, rhonchi or rales.  Abdominal:     General: Abdomen is flat. Bowel sounds are normal.     Palpations: Abdomen is soft.   Musculoskeletal:        General: Normal range of motion.     Cervical back: Normal range of motion.  Skin:    General: Skin is warm and dry.  Neurological:     General: No focal deficit present.     Mental Status: He is alert and oriented to person, place, and time. Mental status is at baseline.  Psychiatric:        Mood and Affect: Mood normal.        Behavior: Behavior normal.      UC Treatments / Results  Labs (all labs ordered are listed, but only abnormal results are displayed) Labs Reviewed - No data to display  EKG   Radiology No results found.  Procedures Procedures (including critical care time)  Medications Ordered in UC Medications - No data to display  Initial Impression / Assessment and Plan / UC Course  I have reviewed the triage vital signs and the nursing notes.  Pertinent labs & imaging results that were available during my care of the patient were reviewed by me and considered in my medical decision making (see chart for details).     Patient has acute upper respiratory symptoms with symptoms being for 2 weeks.  Will opt to treat with antibiotic given duration of symptoms.  Given penicillin allergy, will treat with azithromycin.  Patient had tachycardia on exam with recheck being similar.  EKG was completed noting sinus tachycardia with unknown ST and T wave abnormality.  There is some T wave inversion in precordial leads when compared to previous EKG in 2016.  Patient denies chest pain or shortness of breath so do not think that emergent evaluation is necessary as this may be incidental finding.  Prior to discharge, patient's heart rate had decreased to 97.  Suspect tachycardia is related to acute illness versus patient's reported "white coat syndrome".  Suggested urine sample to evaluate for dehydration as well but patient declined this.  Also suggested chest x-ray to evaluate but patient declined this as well.  Will treat with azithromycin and  benzonatate.  Patient was given cardiology contact information for follow-up for tachycardia as well.  Advised to follow-up as soon as possible.  Patient advised to go to the emergency department if they develop chest pain or shortness of  breath.  Discussed return and ER precautions.  Patient verbalized understanding and was agreeable with plan. Final Clinical Impressions(s) / UC Diagnoses   Final diagnoses:  Acute upper respiratory infection  Acute cough     Discharge Instructions      You have been prescribed antibiotic and a cough medication for your upper respiratory infection.  Please follow-up with cardiologist for further evaluation and management as well given your high heart rate.  Go to the emergency department if you develop chest pain or shortness of breath.    ED Prescriptions     Medication Sig Dispense Auth. Provider   azithromycin (ZITHROMAX Z-PAK) 250 MG tablet Take 2 tablets (500 mg total) by mouth daily for 1 day, THEN 1 tablet (250 mg total) daily for 4 days. 6 tablet [place], Emmet E, [place]   benzonatate (TESSALON) 100 MG capsule Take 1 capsule (100 mg total) by mouth every 8 (eight) hours as needed for cough. 21 capsule [place], Michele Rockers, [place]      PDMP not reviewed this encounter.   Teodora Medici, [place] 06/01/22 [phone]

## 2022-06-01 NOTE — Discharge Instructions (Signed)
You have been prescribed antibiotic and a cough medication for your upper respiratory infection.  Please follow-up with cardiologist for further evaluation and management as well given your high heart rate.  Go to the emergency department if you develop chest pain or shortness of breath.

## 2022-06-01 NOTE — ED Triage Notes (Signed)
Pt presents to uc with co of sinus pressure ,cough and congestion following travle 2 weeks ago, pt reports otalgia has remained ever since trip.

## 2022-06-23 DIAGNOSIS — F331 Major depressive disorder, recurrent, moderate: Secondary | ICD-10-CM | POA: Diagnosis not present

## 2022-07-14 DIAGNOSIS — F331 Major depressive disorder, recurrent, moderate: Secondary | ICD-10-CM | POA: Diagnosis not present

## 2022-08-11 DIAGNOSIS — F331 Major depressive disorder, recurrent, moderate: Secondary | ICD-10-CM | POA: Diagnosis not present

## 2022-09-19 ENCOUNTER — Ambulatory Visit (INDEPENDENT_AMBULATORY_CARE_PROVIDER_SITE_OTHER): Payer: BC Managed Care – PPO

## 2022-09-19 DIAGNOSIS — Z23 Encounter for immunization: Secondary | ICD-10-CM

## 2024-09-21 ENCOUNTER — Ambulatory Visit: Admitting: Allergy

## 2024-09-21 ENCOUNTER — Encounter: Payer: Self-pay | Admitting: Allergy

## 2024-09-21 ENCOUNTER — Other Ambulatory Visit: Payer: Self-pay

## 2024-09-21 VITALS — BP 122/90 | HR 82 | Temp 98.0°F | Resp 16 | Ht 67.5 in | Wt 135.7 lb

## 2024-09-21 DIAGNOSIS — H1013 Acute atopic conjunctivitis, bilateral: Secondary | ICD-10-CM | POA: Diagnosis not present

## 2024-09-21 DIAGNOSIS — J3089 Other allergic rhinitis: Secondary | ICD-10-CM | POA: Diagnosis not present

## 2024-09-21 NOTE — Patient Instructions (Addendum)
 No need to repeat skin testing as you had reaction to the negative control at [facility].   Get bloodwork If significant positives we can discuss doing allergy shots. If negative, no need to follow up.  We are ordering labs, so please allow 1-2 weeks for the results to come back. With the newly implemented Cures Act, the labs might be visible to you at the same time that they become visible to me. However, I will not address the results until all of the results are back, so please be patient.  In the meantime, continue recommendations in your patient instructions, including avoidance measures (if applicable), until you hear from me.  Follow up depending on lab results.  Follow up with ophthalmology as scheduled.

## 2024-09-21 NOTE — Progress Notes (Signed)
 New Patient Note  RE: Spencer Mclean MRN: [id number] DOB: 1996-02-12 Date of Office Visit: 09/21/2024  Consult requested by: Shannon, Jaimie, NP Primary care provider: Cook, Jayce G, DO  Chief Complaint: Establish Care (He states he is be here for allergy test and he has allergy test record of [facility] test before. )  History of Present Illness: I had the pleasure of seeing Spencer Mclean for initial evaluation at the [facility] on 09/21/2024. He is a 28 y.o. male, who is referred here by Clotilda Risen, NP ([facility]) for the evaluation of establish care.  Discussed the use of AI scribe software for clinical note transcription with the patient, who gave verbal consent to proceed.    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 He was referred by [facility] allergy department for further evaluation of his allergy test results.  He has been experiencing symptoms of ocular rosacea for the past five to six months, characterized by dry eyes, burning sensation, and redness. These symptoms have persisted despite treatment. He was diagnosed by an ophthalmologist at [facility] and has been undergoing treatment including doxycycline 100 mg and spironolactone eye drops. He also undergoes intense pulse light therapy (IPL) to manage his condition.  In an attempt to identify potential allergens contributing to his symptoms, he underwent allergy testing at [facility]. The results were inconclusive, with all tests showing positive results, including the negative control. He has not experienced typical allergy symptoms such as itchy or watery eyes, but reports a foreign body sensation and occasional burning. No significant sinus symptoms, though he mentions a persistent postnasal drip and occasional soreness inside his nose.  He has a history of leukemia, which is in remission since 2016.  He has a known allergy to amoxicillin , which causes a full body rash, and a suspected reaction to pegaspargase during chemotherapy. There is a possible reaction to neomycin from infancy, though this is uncertain.  No history of asthma, frequent infections, or significant family history of allergies. He does not have pets and is currently not working. He experiences sensitive skin, particularly heat rash during exercise, and localized reactions to insect bites during the summer.  No recent fevers, chills, or significant changes in appetite or bowel habits. Good breathing and no current rash.     Anosmia: no. Headache: no. Previous work up includes: skin testing on 08/04/2024 at [facility] was all positive including the negative control and they could not explain the test results to patient.  Patient had leukemia in 2016 s/p chemo and in remission.  Reviewed testing results from [facility] and scanned into records.  Assessment and Plan: Spencer Mclean is a 28 y.o. male with:    Evaluation for environmental allergies Ocular rosacea with dry eyes, burning, and redness for 5-6 months. Previous allergy testing inconclusive done at [facility] inconclusive as negative control was positive.  Discussed with patient that no need to repeat skin testing as he had reaction to the negative control at [facility] and instead will get some labs. If significant positives we can discuss doing allergy shots. If negative, no need to follow up. However based on clinical history, I don't think his current eye symptoms are due to environmental allergies. Follow up with ophthalmology as scheduled.   Return if symptoms worsen or fail to improve.  No orders of the defined types were placed in this encounter.  Lab Orders  Allergens w/Total IgE- Respiratory-Area 2      Other allergy screening: Asthma: no Food allergy: no Medication allergy:  Amoxicillin  - rash Pegaspargase Neomycin - unknown.  Hymenoptera allergy:  no Urticaria: no Eczema:no History of recurrent infections suggestive of immunodeficency: no  Diagnostics: None.    Past Medical History: Patient Active Problem List   Diagnosis Date Noted   Annual physical exam 02/20/2022   ALL (acute lymphoid leukemia) in remission ([facility]) 08/07/2020   Depression 08/07/2020   Anxiety 08/07/2020   Past Medical History:  Diagnosis Date   Cancer [facility])    Past Surgical History: Past Surgical History:  Procedure Laterality Date   PORT-A-CATH REMOVAL  10/06/2018   Medication List:  Current Outpatient Medications  Medication Sig Dispense Refill   doxycycline (MONODOX) 100 MG capsule Take by mouth daily.     VITAMIN D, CHOLECALCIFEROL, PO Take by mouth.     No current facility-administered medications for this visit.   Allergies: Allergies[1] Social History: Social History   Socioeconomic History   Marital status: Single    Spouse name: Not on file   Number of children: Not on file   Years of education: Not on file   Highest education level: Not on file  Occupational History   Not on file  Tobacco Use   Smoking status: Never   Smokeless tobacco: Never  Substance and Sexual Activity   Alcohol use: Not Currently    Alcohol/week: 0.0 standard drinks of alcohol   Drug use: Not Currently   Sexual activity: Not Currently  Other Topics Concern   Not on file  Social History Narrative   Not on file   Social Drivers of Health   Tobacco Use: Low Risk (09/21/2024)   Patient History    Smoking Tobacco Use: Never    Smokeless Tobacco Use: Never    Passive Exposure: Not on file  Financial Resource Strain: Not on file  Food Insecurity: Not on file  Transportation Needs: Not on file  Physical Activity: Not on file  Stress: Not on file  Social Connections: Not on file  Depression (PHQ2-9): Low Risk (02/20/2022)   Depression (PHQ2-9)    PHQ-2 Score: 0  Alcohol Screen: Not on file  Housing: Not on file  Utilities: Not on file  Health  Literacy: Not on file   Lives in a house. Smoking: denies Occupation: not employed  Environmental HistorySurveyor, Minerals in the house: yes Carpet in the family room: no Carpet in the bedroom: no Heating: electric Cooling: central Pet: no  Family History: Family History  Problem Relation Age of Onset   Hyperlipidemia Father    Problem                               Relation Asthma                                   no Eczema                                no Food allergy                          no Allergic rhino conjunctivitis     no  Review of Systems  Constitutional:  Negative for appetite change,  chills, fever and unexpected weight change.  HENT:  Positive for postnasal drip. Negative for rhinorrhea.   Eyes:  Positive for itching and visual disturbance.  Respiratory:  Negative for cough, chest tightness, shortness of breath and wheezing.   Cardiovascular:  Negative for chest pain.  Gastrointestinal:  Negative for abdominal pain.  Genitourinary:  Negative for difficulty urinating.  Skin:  Negative for rash.  Neurological:  Negative for headaches.    Objective: BP (!) 122/90 (BP Location: Left Arm, Patient Position: Sitting, Cuff Size: Normal)   Pulse 82   Temp 98 F (36.7 C) (Temporal)   Resp 16   Ht 5' 7.5 (1.715 m)   Wt 135 lb 11.2 oz (61.6 kg)   SpO2 98%   BMI 20.94 kg/m  Body mass index is 20.94 kg/m. Physical Exam Vitals and nursing note reviewed.  Constitutional:      Appearance: Normal appearance. He is well-developed.  HENT:     Head: Normocephalic and atraumatic.     Right Ear: Tympanic membrane and external ear normal.     Left Ear: Tympanic membrane and external ear normal.     Nose: Nose normal.     Mouth/Throat:     Mouth: Mucous membranes are moist.     Pharynx: Oropharynx is clear.  Eyes:     Conjunctiva/sclera: Conjunctivae normal.  Cardiovascular:     Rate and Rhythm: Normal rate and regular rhythm.     Heart sounds: Normal  heart sounds. No murmur heard.    No friction rub. No gallop.  Pulmonary:     Effort: Pulmonary effort is normal.     Breath sounds: Normal breath sounds. No wheezing, rhonchi or rales.  Musculoskeletal:     Cervical back: Neck supple.  Skin:    General: Skin is warm.     Findings: No rash.  Neurological:     Mental Status: He is alert and oriented to person, place, and time.  Psychiatric:        Behavior: Behavior normal.    The plan was reviewed with the patient/family, and all questions/concerned were addressed.  It was my pleasure to see Spencer Mclean today and participate in his care. Please feel free to contact me with any questions or concerns.  Sincerely,  Orlan Cramp, DO Allergy & Immunology  [facility] office: [phone] [facility] office: [phone]     [1]  Allergies Allergen Reactions   Pegaspargase Other (See Comments), Hives, Itching, Rash and Shortness Of Breath    Other reaction(s): Angioedema   Amoxicillin      rash   Neomycin

## 2024-09-24 ENCOUNTER — Encounter: Payer: Self-pay | Admitting: *Deleted

## 2024-09-24 ENCOUNTER — Ambulatory Visit: Admission: EM | Admit: 2024-09-24 | Discharge: 2024-09-24 | Disposition: A | Discharge status: Home / Self Care

## 2024-09-24 ENCOUNTER — Emergency Department (HOSPITAL_COMMUNITY)
Admission: EM | Admit: 2024-09-24 | Discharge: 2024-09-24 | Disposition: A | Discharge status: Home / Self Care | Attending: Student in an Organized Health Care Education/Training Program | Admitting: Student in an Organized Health Care Education/Training Program

## 2024-09-24 ENCOUNTER — Encounter (HOSPITAL_COMMUNITY): Payer: Self-pay

## 2024-09-24 ENCOUNTER — Other Ambulatory Visit: Payer: Self-pay

## 2024-09-24 DIAGNOSIS — R230 Cyanosis: Secondary | ICD-10-CM | POA: Diagnosis not present

## 2024-09-24 DIAGNOSIS — L818 Other specified disorders of pigmentation: Secondary | ICD-10-CM | POA: Insufficient documentation

## 2024-09-24 DIAGNOSIS — L819 Disorder of pigmentation, unspecified: Secondary | ICD-10-CM

## 2024-09-24 LAB — CBC WITH DIFFERENTIAL/PLATELET
Abs Immature Granulocytes: 0.01 K/uL (ref 0.00–0.07)
Basophils Absolute: 0 K/uL (ref 0.0–0.1)
Basophils Relative: 1 %
Eosinophils Absolute: 0 K/uL (ref 0.0–0.5)
Eosinophils Relative: 0 %
HCT: 46.3 % (ref 39.0–52.0)
Hemoglobin: 15.9 g/dL (ref 13.0–17.0)
Immature Granulocytes: 0 %
Lymphocytes Relative: 37 %
Lymphs Abs: 1.7 K/uL (ref 0.7–4.0)
MCH: 30.9 pg (ref 26.0–34.0)
MCHC: 34.3 g/dL (ref 30.0–36.0)
MCV: 89.9 fL (ref 80.0–100.0)
Monocytes Absolute: 0.4 K/uL (ref 0.1–1.0)
Monocytes Relative: 8 %
Neutro Abs: 2.6 K/uL (ref 1.7–7.7)
Neutrophils Relative %: 54 %
Platelets: 172 K/uL (ref 150–400)
RBC: 5.15 MIL/uL (ref 4.22–5.81)
RDW: 11.7 % (ref 11.5–15.5)
WBC: 4.7 K/uL (ref 4.0–10.5)
nRBC: 0 % (ref 0.0–0.2)

## 2024-09-24 LAB — BASIC METABOLIC PANEL WITH GFR
Anion gap: 11 (ref 5–15)
BUN: 11 mg/dL (ref 6–20)
CO2: 26 mmol/L (ref 22–32)
Calcium: 9.6 mg/dL (ref 8.9–10.3)
Chloride: 103 mmol/L (ref 98–111)
Creatinine, Ser: 0.81 mg/dL (ref 0.61–1.24)
GFR, Estimated: >60 mL/min
Glucose, Bld: 102 mg/dL — ABNORMAL HIGH (ref 70–99)
Potassium: 4.2 mmol/L (ref 3.5–5.1)
Sodium: 140 mmol/L (ref 135–145)

## 2024-09-24 LAB — PROTIME-INR
INR: 1 (ref 0.8–1.2)
Prothrombin Time: 13.5 s (ref 11.4–15.2)

## 2024-09-24 MED ORDER — CEPHALEXIN 500 MG PO CAPS: 500.0000 mg | ORAL_CAPSULE | Freq: Four times a day (QID) | ORAL | 0 refills | Status: AC

## 2024-09-24 MED ORDER — NITROGLYCERIN 2 % TD OINT
0.5000 [in_us] | TOPICAL_OINTMENT | Freq: Once | TRANSDERMAL | Status: AC
  Administered 2024-09-24: 0.5 [in_us] via TOPICAL
  Filled 2024-09-24: qty 1

## 2024-09-24 NOTE — ED Provider Notes (Signed)
 " [place] [facility] Provider Note   CSN: [id number] Arrival date & time: 09/24/24  1335     Patient presents with: Nail Problem   Spencer Mclean is a 28 y.o. male.   28 year old male presenting with discolored toes.  Patient notes 5 days ago some mild discoloration primarily to the 2nd and 3rd toes on his left foot, this was initially associated with itching but this has since resolved.  He continues to note discoloration that is now affecting some toes on his right foot as well, endorses that his feet/legs feel cold at times but denies any numbness/tingling/sensory changes.  Was seen at urgent care today and instructed to come to the emergency department for further evaluation.  History of AL L in 2016, in remission.        Prior to Admission medications  Medication Sig Start Date End Date Taking? Authorizing Provider  doxycycline (MONODOX) 100 MG capsule Take by mouth daily.    [provider]  VITAMIN D, CHOLECALCIFEROL, PO Take by mouth.    [provider]    Allergies: Pegaspargase, Amoxicillin , and Neomycin    Review of Systems  Updated Vital Signs  Vitals:   09/24/24 1339 09/24/24 1345  BP: (!) 143/95   Pulse: 83   Resp: 17   Temp: 98.4 F (36.9 C)   SpO2: 100%   Weight:  61.6 kg  Height:  5' 7.5 (1.715 m)     Physical Exam Vitals and nursing note reviewed.  HENT:     Head: Normocephalic.  Eyes:     Extraocular Movements: Extraocular movements intact.  Cardiovascular:     Rate and Rhythm: Normal rate.     Pulses:          Dorsalis pedis pulses are 2+ on the right side and 2+ on the left side.       Posterior tibial pulses are 2+ on the right side and 2+ on the left side.  Pulmonary:     Effort: Pulmonary effort is normal.  Musculoskeletal:     Cervical back: Normal range of motion.     Comments: Moves all extremities spontaneously without difficulty No appreciable sensory deficits of bilateral  lower extremities  Skin:    General: Skin is warm and dry.     Comments: Red/purple discoloration noted to multiple toes on both feet, see photos obtained at urgent care.  Discoloration does blanch with pressure, normal capillary refill. No tenderness/erythema/warmth of calves bilaterally.   Neurological:     Mental Status: He is alert and oriented to person, place, and time.     Media Information   Document Information  Photographic Image: Photos  Right foot  09/24/2024 13:10  Attached To:  Hospital Encounter on 09/24/24  Source Information  Teresa Almarie DELENA DEVONNA  [facility]    Media Information   Document Information  Photographic Image: Photos  Left foot  09/24/2024 13:10  Attached To:  Hospital Encounter on 09/24/24  Source Information  White, Almarie DELENA DEVONNA  [facility]    (all labs ordered are listed, but only abnormal results are displayed) Labs Reviewed  BASIC METABOLIC PANEL WITH GFR - Abnormal; Notable for the following components:      Result Value   Glucose, Bld 102 (*)    All other components within normal limits  CBC WITH DIFFERENTIAL/PLATELET  PROTIME-INR    EKG: None  Radiology: No results found.   Procedures   Medications  Ordered in the ED  nitroGLYCERIN  (NITROGLYN) 2 % ointment 0.5 inch (0.5 inches Topical Given 09/24/24 1740)                                    Medical Decision Making This patient presents to the ED for concern of discoloration of toes, this involves an extensive number of treatment options, and is a complaint that carries with it a high risk of complications and morbidity.  The differential diagnosis includes soft tissue skin infection like cellulitis/erysipelas/bullous impetigo, Raynaud's, other vascular abnormality   Co morbidities that complicate the patient evaluation  History of ALL - in remission   Additional history obtained:  Additional history obtained from record  review External records from outside source obtained and reviewed including urgent care note from earlier today   Lab Tests:  I Ordered, and personally interpreted labs.  The pertinent results include: CBC within normal limits.  BMP unremarkable.  PT/INR normal.  Cardiac Monitoring: / EKG:  The patient was maintained on a cardiac monitor.  I personally viewed and interpreted the cardiac monitored which showed an underlying rhythm of: NSR   Problem List / ED Course / Critical interventions / Medication management  I ordered medication including topical nitroglycerin   for suspected Reynaud's  Reevaluation of the patient after these medicines showed that the patient stayed the same I have reviewed the patients home medicines and have made adjustments as needed   Test / Admission - Considered:  Physical exam notable as above, see photos.  Note from urgent care reviewed, provider at urgent care was concerned that there could be an embolic nature of patient's complaint, I discussed the patient's case with my attending Dr. Corinthia and we are both in agreement that it is very unlikely that the patient's toe discoloration affecting multiple toes on both feet would be representative of an embolic cause, as patient has adequate pulses bilaterally and no sensory deficits or evidence of vascular compromise bilaterally.  Patient symptoms are much more likely to be reflective of conditions like Raynaud's versus a soft tissue skin infection.  Topical nitroglycerin  applied in the emergency department today to assess for symptomatic improvement, as this would be consistent with Raynaud's, unfortunately patient's toe discoloration did not improve after application of this.  At this time I feel the patient's symptoms are most likely reflective of a soft tissue skin infection, although conditions like Raynaud's cannot be entirely excluded. Will have him start on Keflex  and follow-up with podiatry for a second  opinion.  Return precautions discussed in depth, patient voiced understanding is in agreement this plan, he is appropriate for discharge at this time.    Risk Prescription drug management.        Final diagnoses:  Discoloration of skin of toe    ED Discharge Orders          Ordered    cephALEXin  (KEFLEX ) 500 MG capsule  4 times daily        09/24/24 1825               Glendia Rocky SAILOR, PA-C 09/24/24 1831  "

## 2024-09-24 NOTE — ED Triage Notes (Signed)
 Pt bib pov from home c/o bilaterally toe color changes. Pt states a few days ago he had itching and noticed the tips of his toes started to turn colors. Pt toes are red and blanchable  with +2 pulses. Pt denies any changes to shoes, detergent, and pain at this time.   Pt denies fever and/or chills.  Pt states the first day he noticed itching he put some hydrocortisone ointment.

## 2024-09-24 NOTE — ED Provider Triage Note (Signed)
 Emergency Medicine Provider Triage Evaluation Note  Spencer Mclean , a 28 y.o. male  was evaluated in triage.  Pt complains of toe discoloration. For the past 2-3 days pt notice changes in color of the toes on his left foot.  Felt feet is cool and endorse some mild discomfort.  No fever, chills, cp, sob, backpain, abd pain.  No trauma.  Hx of ALL in remission.  Denies alcohol or tobacco use.  No hx of Buerger's or Raynalds  Review of Systems  Positive: As above Negative: As above  Physical Exam  BP (!) 143/95 (BP Location: Right Arm)   Pulse 83   Temp 98.4 F (36.9 C)   Resp 17   Ht 5' 7.5 (1.715 m)   Wt 61.6 kg   SpO2 100%   BMI 20.94 kg/m  Gen:   Awake, no distress  = Resp:  Normal effort  MSK:   Moves extremities without difficulty  Other:  L foot is cool to the touch, erythematous skin changes to tip of 2nd,3rd,4th toes  Medical Decision Making  Medically screening exam initiated at 2:04 PM.  Appropriate orders placed.  Maui Britten was informed that the remainder of the evaluation will be completed by another provider, this initial triage assessment does not replace that evaluation, and the importance of remaining in the ED until their evaluation is complete.  Pulses detected using bedside doppler Concerns for thromboangiitis obliterans   Nivia Colon, PA-C 09/24/24 1422

## 2024-09-24 NOTE — Discharge Instructions (Addendum)
 Physical exam findings are slightly concerning for possible embolic event to the toes bilateral.  Pedal pulses are palpable and do not believe that there is aneurysmal changes of the popliteal or femoral arteries.  Due to the presentation and concern for possible emboli we recommend further evaluation at the emergency department where this can be effectively ruled out.  Discussed that other possibilities could be Raynaud's or other vasoconstrictive issues or possibly a rash but these do seem less consistent with the presentation.  Patient will go to the emergency department now for further evaluation

## 2024-09-24 NOTE — Discharge Instructions (Addendum)
 The discoloration on the skin of your toes may be a sign of a skin infection, start Keflex  and take 1 tablet by mouth 4 times daily for 5 days.  I have provided you with the contact information for a podiatrist, please contact their office to schedule follow-up.  Return to the emergency department if your symptoms worsen.  Follow-up with your PCP as needed.

## 2024-09-24 NOTE — ED Triage Notes (Signed)
 Pt reports toes on lt foot are red for 5 day sand now toes on RT  foot are turning red. PT reports last night he felt tingling to toe.

## 2024-09-24 NOTE — ED Provider Notes (Signed)
 " [facility]    CSN: [id number] Arrival date & time: 09/24/24  1137      History   Chief Complaint Chief Complaint  Patient presents with   skin color changes    HPI Spencer Mclean is a 28 y.o. male.   28 year old male who presents urgent care with complaints of discoloration of his toes.  He reports that 5 days ago he started noticing color changes to the distal aspects of his toes on the left foot specifically the 2nd and 3rd toes.  He has now started to notice color changes on the distal aspect of the right toes.  These areas do seem to be tingling especially when his legs were up at night.  He denies any history of this in the past.  He denies any history of arterial aneurysm or clotting disorder.  He has a history of ALL that is currently in remission.  He does not have any claudication symptoms.  The areas are not itchy.     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 Authorizing Provider  doxycycline (MONODOX) 100 MG capsule Take by mouth daily.    [provider]  VITAMIN D, CHOLECALCIFEROL, PO Take by mouth.    [provider]    Family History Family History  Problem Relation Age of Onset   Hyperlipidemia Father     Social History Social History[1]   Allergies   Pegaspargase, Amoxicillin , and Neomycin   Review of Systems Review of Systems  Constitutional:  Negative for chills and fever.  HENT:  Negative for ear pain and sore throat.   Eyes:  Negative for pain and visual disturbance.  Respiratory:  Negative for cough and  shortness of breath.   Cardiovascular:  Negative for chest pain and palpitations.  Gastrointestinal:  Negative for abdominal pain and vomiting.  Genitourinary:  Negative for dysuria and hematuria.  Musculoskeletal:  Negative for arthralgias and back pain.  Skin:  Positive for color change (Toes bilateral). Negative for rash.  Neurological:  Negative for seizures and syncope.  All other systems reviewed and are negative.    Physical Exam Triage Vital Signs ED Triage Vitals  Encounter Vitals Group     BP 09/24/24 1221 134/83     Girls Systolic BP Percentile --      Girls Diastolic BP Percentile --      Boys Systolic BP Percentile --      Boys Diastolic BP Percentile --      Pulse Rate 09/24/24 1221 82     Resp 09/24/24 1221 18     Temp 09/24/24 1221 98.1 F (36.7 C)     Temp src --      SpO2 09/24/24 1221 98 %     Weight --      Height --      Head Circumference --      Peak Flow --      Pain Score 09/24/24 1220 0     Pain Loc --      Pain Education --  Exclude from Growth Chart --    No data found.  Updated Vital Signs BP 134/83   Pulse 82   Temp 98.1 F (36.7 C)   Resp 18   SpO2 98%   Visual Acuity Right Eye Distance:   Left Eye Distance:   Bilateral Distance:    Right Eye Near:   Left Eye Near:    Bilateral Near:     Physical Exam Vitals and nursing note reviewed.  Constitutional:      General: He is not in acute distress.    Appearance: He is well-developed.  HENT:     Head: Normocephalic and atraumatic.  Eyes:     Conjunctiva/sclera: Conjunctivae normal.  Cardiovascular:     Rate and Rhythm: Normal rate and regular rhythm.     Pulses:          Dorsalis pedis pulses are 2+ on the right side and 2+ on the left side.       Posterior tibial pulses are 2+ on the right side and 2+ on the left side.     Heart sounds: No murmur heard. Pulmonary:     Effort: Pulmonary effort is normal. No respiratory distress.     Breath sounds: Normal breath  sounds.  Abdominal:     Palpations: Abdomen is soft.     Tenderness: There is no abdominal tenderness.  Musculoskeletal:        General: No swelling.     Cervical back: Neck supple.  Feet:     Right foot:     Skin integrity: Skin integrity normal.     Left foot:     Skin integrity: Skin integrity normal.     Comments: See media, areas are somewhat blanchable and not fixed Skin:    General: Skin is warm and dry.     Capillary Refill: Capillary refill takes less than 2 seconds.  Neurological:     Mental Status: He is alert.  Psychiatric:        Mood and Affect: Mood normal.         UC Treatments / Results  Labs (all labs ordered are listed, but only abnormal results are displayed) Labs Reviewed - No data to display  EKG   Radiology No results found.  Procedures Procedures (including critical care time)  Medications Ordered in UC Medications - No data to display  Initial Impression / Assessment and Plan / UC Course  I have reviewed the triage vital signs and the nursing notes.  Pertinent labs & imaging results that were available during my care of the patient were reviewed by me and considered in my medical decision making (see chart for details).     Blue toes   Physical exam findings are slightly concerning for possible embolic event to the toes bilateral.  Pedal pulses are palpable and do not believe that there is aneurysmal changes of the popliteal or femoral arteries.  Cannot rule out hypercoagulable disorders.  Due to the presentation and concern for possible emboli we recommend further evaluation at the emergency department where this can be effectively ruled out.  Discussed that other possibilities could be Raynaud's or other vasoconstrictive issues or possibly a rash but these do seem less consistent with the presentation.  Patient will go to the emergency department now for further evaluation  Final Clinical Impressions(s) / UC Diagnoses   Final  diagnoses:  Blue toes     Discharge Instructions      Physical exam findings are slightly  concerning for possible embolic event to the toes bilateral.  Pedal pulses are palpable and do not believe that there is aneurysmal changes of the popliteal or femoral arteries.  Due to the presentation and concern for possible emboli we recommend further evaluation at the emergency department where this can be effectively ruled out.  Discussed that other possibilities could be Raynaud's or other vasoconstrictive issues or possibly a rash but these do seem less consistent with the presentation.  Patient will go to the emergency department now for further evaluation    ED Prescriptions   None    PDMP not reviewed this encounter.    [1]  Social History Tobacco Use   Smoking status: Never   Smokeless tobacco: Never  Substance Use Topics   Alcohol use: Not Currently    Alcohol/week: 0.0 standard drinks of alcohol   Drug use: Not Currently     Teresa Almarie LABOR, PA-C 09/24/24 1320  "

## 2024-09-24 NOTE — ED Triage Notes (Signed)
 Patient reports red, then purple and white toes, now non-blanching, hx of leukemia. Sent by UC.

## 2024-09-26 ENCOUNTER — Ambulatory Visit: Payer: Self-pay | Admitting: Allergy

## 2024-09-26 LAB — ALLERGEN PROFILE WITH TOTAL IGE, RESPIRATORY-AREA 2
Alternaria Alternata IgE: <0.1 kU/L
Aspergillus Fumigatus IgE: <0.1 kU/L
Bermuda Grass IgE: <0.1 kU/L
Cat Dander IgE: <0.1 kU/L
Cedar, Mountain IgE: <0.1 kU/L
Cladosporium Herbarum IgE: <0.1 kU/L
Cockroach, German IgE: <0.1 kU/L
Common Silver Birch IgE: <0.1 kU/L
Cottonwood IgE: <0.1 kU/L
D Farinae IgE: <0.1 kU/L
D Pteronyssinus IgE: 0.1 kU/L — AB
Dog Dander IgE: <0.1 kU/L
Elm, American IgE: <0.1 kU/L
IgE (Immunoglobulin E), Serum: 92 [IU]/mL (ref 6–495)
Johnson Grass IgE: <0.1 kU/L
Maple/Box Elder IgE: <0.1 kU/L
Mouse Urine IgE: <0.1 kU/L
Oak, White IgE: <0.1 kU/L
Pecan, Hickory IgE: <0.1 kU/L
Penicillium Chrysogen IgE: <0.1 kU/L
Pigweed, Rough IgE: <0.1 kU/L
Ragweed, Short IgE: <0.1 kU/L
Sheep Sorrel IgE Qn: <0.1 kU/L
Timothy Grass IgE: <0.1 kU/L
White Mulberry IgE: <0.1 kU/L

## 2024-10-25 ENCOUNTER — Ambulatory Visit: Admitting: Podiatry

## 2024-10-25 ENCOUNTER — Encounter: Payer: Self-pay | Admitting: Podiatry

## 2024-10-25 DIAGNOSIS — I7300 Raynaud's syndrome without gangrene: Secondary | ICD-10-CM

## 2024-10-25 NOTE — Progress Notes (Signed)
 Patient presents for complaint of itching and some pain in the toes 1 through 5 bilaterally.  Noticed it several weeks ago.  Has never had problems like this in the past.  Does not complain of any problems with his hands other than the fingers being cold a lot.  Does not smoke.  No history of heart disease.  No history of any systemic arthritides.   Physical exam:  General appearance: Pleasant, and in no acute distress. AOx3.  Vascular: Pedal pulses: DP 2/4 bilaterally, PT 2/4 bilaterally.  No edema lower legs bilaterally. Capillary fill time slightly delayed bilaterally.  Neurological: Light touch intact feet bilaterally.  Normal Achilles reflex bilaterally.  No clonus or spasticity noted.   Dermatologic:   Toes are 1 through 5 are somewhat erythematous and cold to touch.  Area of healing blister along the proximal nail fold of the third toenail left.  No blebs or cyanosis noted.  Musculoskeletal: Mild hammertoes 2 through 5 bilaterally    Diagnosis: 1.  Raynaud's syndrome bilaterally  Plan: -New patient office visit for evaluation and management level 3. - Discussed with her Raynaud's syndrome and etiology and treatment.  He has been seeing an ophthalmologist for dry eyes.   I explained to him that sometimes there are systemic arthritides and other pathologies that can be associated with Raynaud's but most of the time it is not associated with underlying disease.  History of any heart disease or dizzy episodes or lightheadedness.  Explained that it is mostly avoidance and keeping core body temperature up and keeping hands and feet well covered in cold weather.  He can use topical OTC 1% lidocaine  gel on it which can help act as a vasodilator.  Warm water soaks can help during acute episodes also.  Discussed the third toe where the nail had had some blistering around it.  I explained that sometimes the nails can loosen up after a ischemic episode from Raynaud's and that if this happens he  can call us .  If he does get any acute episodes in the toes in the coming cold weather he should call us  for an appointment.  Return as needed

## 2024-10-28 ENCOUNTER — Ambulatory Visit: Admitting: Podiatry
